# Patient Record
Sex: Male | Born: 2000 | Hispanic: Yes | Marital: Single | Smoking: Never smoker
Health system: Southern US, Community
[De-identification: ages and names within clinical notes are randomized; demographics above are authoritative.]

## PROBLEM LIST (undated history)

## (undated) DIAGNOSIS — J45909 Unspecified asthma, uncomplicated: Secondary | ICD-10-CM

---
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
## 2020-10-23 ENCOUNTER — Other Ambulatory Visit: Payer: Self-pay

## 2020-10-23 ENCOUNTER — Ambulatory Visit (INDEPENDENT_AMBULATORY_CARE_PROVIDER_SITE_OTHER): Payer: BC Managed Care – PPO | Admitting: Mental Health

## 2020-10-23 DIAGNOSIS — F4320 Adjustment disorder, unspecified: Secondary | ICD-10-CM

## 2020-10-23 NOTE — Progress Notes (Signed)
[facility] Counselor Initial Adult Exam  Name: Craig Chase Date: 10/23/2020 MRN: [id number] DOB: 2000/12/17 PCP: Sigmund Hazel, MD  Time spent: 31  Minutes   Reason for Visit [name] Problem: Patient stated he and his biological father have had issues in the relationship since last September. He lives w/ his stepfather, mother and his brother. His mother married his stepfather about 8 years ago. Patient would see his father on weekends. He began to realize he did not want to go see his father who continues to live in [place]. He came to learn that his father was unfaithful to his mother which was a catalyst to the marriage ending. Patient tried to talk w/ his father last year about getting the truth as his father has not been accountable for his behaviors that led to the end of the marriage. He wants to not take the pain in of this relationship into his adult life. He does not want to take his father's qualities into his future relationships. He wants to ensure he practices empathy in his current and future relationships.   Mental Status Exam:    Appearance:    Casual     Behavior:   Appropriate  Motor:   WNL  Speech/Language:    Clear and Coherent  Affect:   Full range   Mood:   Euthymic  Thought process:   normal  Thought content:     WNL  Sensory/Perceptual disturbances:     none  Orientation:   x4  Attention:   Good  Concentration:   Good  Memory:   Intact  Fund of knowledge:    Consistent with age and development  Insight:     Good  Judgment:    Good  Impulse Control:   Good    Reported Symptoms:   Some anxiety and sad feelings  Risk Assessment: Danger to Self:  No Self-injurious Behavior: No Danger to Others: No Duty to Warn:no Physical Aggression / Violence:No  Access to Firearms a concern: No  Gang Involvement:No  Patient / guardian was educated about steps to take if suicide or homicide risk level increases between visits: yes While future psychiatric events  cannot be accurately predicted, the patient does not currently require acute inpatient psychiatric care and does not currently meet [facility] involuntary commitment criteria.  Substance Abuse History: Current substance abuse: none  Past Psychiatric History:   Outpatient Providers: [facility] - 2019 History of Psych Hospitalization: No  Psychological Testing: none   Abuse History: Victim - none Report needed: No. Victim of Neglect:No. Perpetrator of none  Witness / Exposure to Domestic Violence: No   Protective Services Involvement: No  Witness to [employer] Violence:  No   Family History: Raised by mother and stepfather. Would see bio father on weekends  Living situation: the patient lives with their family  Relationship Status: single  Support Systems; mother, family, friend   Surveyor, quantity Stress:  none  Income/Employment/Disability: retail- part time  Financial planner: none  Educational History: Education: [employer]- [employer]  Religion/Sprituality/World View:   none  Any cultural differences that may affect / interfere with treatment:  none  Recreation/Hobbies:  Thrift store, gym, movies  Stressors: none  Strengths: intelligent, support system  Barriers:  none  Medical History/Surgical History:  Past Medical History:  Diagnosis Date  . Asthma      Medications: Current Outpatient Medications  Medication Sig Dispense Refill  . ibuprofen (ADVIL,MOTRIN) 600 MG tablet Take 1 tablet (600 mg total) by mouth every  6 (six) hours as needed for moderate pain. 30 tablet 0   No current facility-administered medications for this visit.     Diagnoses:    ICD-10-CM   1. Adjustment disorder, unspecified type  F43.20     Plan of Care: TBD   [name] Session, [facility]

## 2020-11-11 ENCOUNTER — Other Ambulatory Visit: Payer: Self-pay

## 2020-11-11 ENCOUNTER — Ambulatory Visit (INDEPENDENT_AMBULATORY_CARE_PROVIDER_SITE_OTHER): Payer: BC Managed Care – PPO | Admitting: Mental Health

## 2020-11-11 DIAGNOSIS — F4320 Adjustment disorder, unspecified: Secondary | ICD-10-CM

## 2020-11-11 NOTE — Progress Notes (Signed)
[facility] Counselor Initial Adult Exam  Name: Craig Chase Date: 11/11/2020 MRN: [id number] DOB: [birth year] PCP: Sigmund Hazel, MD  Time spent: 34  Minutes   Treatment:  Individual therapy  Mental Status Exam:    Appearance:    Casual     Behavior:   Appropriate  Motor:   WNL  Speech/Language:    Clear and Coherent  Affect:   Full range   Mood:   Euthymic  Thought process:   normal  Thought content:     WNL  Sensory/Perceptual disturbances:     none  Orientation:   x4  Attention:   Good  Concentration:   Good  Memory:   Intact  Fund of knowledge:    Consistent with age and development  Insight:     Good  Judgment:    Good  Impulse Control:   Good    Reported Symptoms:   Some anxiety and sad feelings  Risk Assessment: Danger to Self:  No Self-injurious Behavior: No Danger to Others: No Duty to Warn:no Physical Aggression / Violence:No  Access to Firearms a concern: No  Gang Involvement:No  Patient / guardian was educated about steps to take if suicide or homicide risk level increases between visits: yes While future psychiatric events cannot be accurately predicted, the patient does not currently require acute inpatient psychiatric care and does not currently meet [facility] involuntary commitment criteria.  Medications: Current Outpatient Medications  Medication Sig Dispense Refill  . ibuprofen (ADVIL,MOTRIN) 600 MG tablet Take 1 tablet (600 mg total) by mouth every 6 (six) hours as needed for moderate pain. 30 tablet 0   No current facility-administered medications for this visit.    Subjective:  Patient presents on time for today's session.  Continue to assess needs and recent events.  He said he had a pleasant Mother's Day sharing experiences he had with family.  He shared more detail and content related to the relationship with his father, often feeling judged or criticized by him as an example, patient shared how his father might even criticize his  clothing choices.  Patient's attire is conservative and he reflects how this and other choices that reflect who he is but not what his father prefers, that his father tends to focus on his own opinions and not giving value to patient's thoughts or opinions.  He continues to express wanting to work on being mindful of traits or characteristics with which he may have learned from his father that may negatively affect relationships for himself going forward.  Encouraged him to consider between sessions instances where he notices these tendencies.  Diagnoses:    ICD-10-CM   1. Adjustment disorder, unspecified type  F43.20      Plan: Patient is to use CBT, mindfulness and coping skills to help manage decrease symptoms. Patient    Long-term goal:  Reduce overall level, frequency, and intensity of the feelings of depression, anxiety and panic so that daily functioning is not impaired.  Short-term goal:  Increase insight into how his strained relationship with his father affects his feelings and behaviors        Increase awareness of his negative and/or critical communication with others                              Verbalize an understanding of the role that distorted thinking plays in creating fears, excessive worry, and ruminations.  Assessment of progress:  progressing  [name] Session, [facility]

## 2020-11-25 ENCOUNTER — Other Ambulatory Visit: Payer: Self-pay

## 2020-11-25 ENCOUNTER — Ambulatory Visit (INDEPENDENT_AMBULATORY_CARE_PROVIDER_SITE_OTHER): Payer: BC Managed Care – PPO | Admitting: Mental Health

## 2020-11-25 DIAGNOSIS — F4320 Adjustment disorder, unspecified: Secondary | ICD-10-CM

## 2020-11-25 NOTE — Progress Notes (Signed)
[facility] Counselor psychotherapy note  Name: Craig Chase Date: 11/25/2020 MRN: [id number] DOB: 17-Oct-2000 PCP: Sigmund Hazel, MD  Time spent: 61  Minutes   Treatment:  Individual therapy  Mental Status Exam:    Appearance:    Casual     Behavior:   Appropriate  Motor:   WNL  Speech/Language:    Clear and Coherent  Affect:   Full range   Mood:   Euthymic  Thought process:   normal  Thought content:     WNL  Sensory/Perceptual disturbances:     none  Orientation:   x4  Attention:   Good  Concentration:   Good  Memory:   Intact  Fund of knowledge:    Consistent with age and development  Insight:     Good  Judgment:    Good  Impulse Control:   Good    Reported Symptoms:   Some anxiety and sad feelings  Risk Assessment: Danger to Self:  No Self-injurious Behavior: No Danger to Others: No Duty to Warn:no Physical Aggression / Violence:No  Access to Firearms a concern: No  Gang Involvement:No  Patient / guardian was educated about steps to take if suicide or homicide risk level increases between visits: yes While future psychiatric events cannot be accurately predicted, the patient does not currently require acute inpatient psychiatric care and does not currently meet [facility] involuntary commitment criteria.  Medications: Current Outpatient Medications  Medication Sig Dispense Refill  . ibuprofen (ADVIL,MOTRIN) 600 MG tablet Take 1 tablet (600 mg total) by mouth every 6 (six) hours as needed for moderate pain. 30 tablet 0   No current facility-administered medications for this visit.    Subjective:  Patient presents on time for today's session.  Patient shared how he had a recent challenging work experience. 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 Patient stated that he is new to this department at his job and was surprised the coworker was critical of him as he was  informed by his supervisor. Patient denied that he was reprimanded in any way, that it was not a formal complaint. Patient went on to share how he was contacted recently by his paternal uncle, who patient feels took more of the side of his father as patient and his father remain relationally strained with no contact. Patient shared again his efforts to communicate his feelings with his father, however, his father struggled to acknowledge his feelings regarding a falling out they had had months prior. Personal identified scene for his father to actively listen to his thoughts feelings and needs versus dictating to him from a parental hierarchical framework, which patient shared has been typical over the last few years, I'll be patient is now an adult. Patient processed feelings related to the discussion with his uncle, wanting to have a close relationship with his paternal side of the family however, is hesitant to visit them, which is in [place] due to knowing his father well make himself present at some point during the visit, which patient is not comfortable due to these past events.   Interventions: Supportive therapy, motivational interviewing  Diagnoses:    ICD-10-CM   1. Adjustment disorder, unspecified type  F43.20      Plan: Patient is to use CBT, mindfulness and coping skills to help manage decrease symptoms. Patient to continue to advocate and expressing his thoughts and feelings related to his family members as needed.  Long-term goal:  Reduce overall level, frequency, and intensity of the feelings of depression, anxiety and panic so that daily functioning is not impaired.  Short-term goal:  Increase insight into how his strained relationship with his father affects his feelings and behaviors        Increase awareness of his negative and/or critical communication with others                              Verbalize an understanding of the role that distorted thinking plays in creating  fears, excessive worry, and ruminations.  Assessment of progress:  progressing    [name] Session, [facility]

## 2020-12-21 ENCOUNTER — Ambulatory Visit (INDEPENDENT_AMBULATORY_CARE_PROVIDER_SITE_OTHER): Payer: BC Managed Care – PPO | Admitting: Mental Health

## 2020-12-21 ENCOUNTER — Other Ambulatory Visit: Payer: Self-pay

## 2020-12-21 DIAGNOSIS — F4320 Adjustment disorder, unspecified: Secondary | ICD-10-CM

## 2020-12-21 NOTE — Progress Notes (Signed)
[facility] psychotherapy note  Name: Craig Chase Date: 12/21/2020 MRN: [id number] DOB: [birth year] PCP: Sigmund Hazel, MD  Time spent: 36 Minutes   Treatment:  Individual therapy  Mental Status Exam:    Appearance:    Casual     Behavior:   Appropriate  Motor:   WNL  Speech/Language:    Clear and Coherent  Affect:   Full range   Mood:   Euthymic  Thought process:   normal  Thought content:     WNL  Sensory/Perceptual disturbances:     none  Orientation:   x4  Attention:   Good  Concentration:   Good  Memory:   Intact  Fund of knowledge:    Consistent with age and development  Insight:     Good  Judgment:    Good  Impulse Control:   Good    Reported Symptoms:   Some anxiety and sad feelings  Risk Assessment: Danger to Self:  No Self-injurious Behavior: No Danger to Others: No Duty to Warn:no Physical Aggression / Violence:No  Access to Firearms a concern: No  Gang Involvement:No  Patient / guardian was educated about steps to take if suicide or homicide risk level increases between visits: yes While future psychiatric events cannot be accurately predicted, the patient does not currently require acute inpatient psychiatric care and does not currently meet [facility] involuntary commitment criteria.  Medications: Current Outpatient Medications  Medication Sig Dispense Refill   ibuprofen (ADVIL,MOTRIN) 600 MG tablet Take 1 tablet (600 mg total) by mouth every 6 (six) hours as needed for moderate pain. 30 tablet 0   No current facility-administered medications for this visit.    Subjective:  Patient presents on time for today's session.  He shared recent events where he did not reach out to his father on Father's Day which was yesterday.  He went on to share how they continue to have no contact with 1 another, how patient wants to keep this boundary in place due to past events in the relationship.  Patient went on to share how he wants his father to  recognize how he has made him feel as patient went on to give more details related to having to cope with his father's judgment for many years and at times being compared to his younger brother.  He shared how he feels that his father tried to replicate patient and his brother being similar as his father and brother grew up extremely close and had many similar interests.  Patient shared how he is the "polar opposite" of his brother in many ways.  Patient shared how he has always been on the thin side regarding weight and how his father and his family's would call him "delgada", which is the Spanish translation for "thin".  He stated his father often would focus negatively on patient, commenting on his not being outgoing enough, not having enough interest in athletics as examples.  Patient shared how he feels that he limits how close he will share interpersonal thoughts and feelings with others in relationships.  He stated that he has noticed that he "hold back" when potential relationships seem to get more serious or significant.  Encouraged him to consider reasons for this tendency between sessions.  Also discussed potential benefits of journaling, writing a letter to his father in his journal for flexion.   Interventions: Supportive therapy, motivational interviewing  Diagnoses:    ICD-10-CM   1. Adjustment disorder, unspecified type  F43.20  Plan: Patient is to use CBT, mindfulness and coping skills to help manage decrease symptoms. Patient to continue to advocate and expressing his thoughts and feelings related to his family members as needed.   Long-term goal:  Reduce overall level, frequency, and intensity of the feelings of depression, anxiety and panic so that daily functioning is not impaired.  Short-term goal:  Increase insight into how his strained relationship with his father affects his feelings and behaviors        Increase awareness of his negative and/or critical  communication with others                              Verbalize an understanding of the role that distorted thinking plays in creating fears, excessive worry, and ruminations.  Assessment of progress:  progressing    [name] Session, [place]

## 2021-01-26 ENCOUNTER — Ambulatory Visit (INDEPENDENT_AMBULATORY_CARE_PROVIDER_SITE_OTHER): Payer: BC Managed Care – PPO | Admitting: Mental Health

## 2021-01-26 ENCOUNTER — Other Ambulatory Visit: Payer: Self-pay

## 2021-01-26 DIAGNOSIS — F4320 Adjustment disorder, unspecified: Secondary | ICD-10-CM | POA: Diagnosis not present

## 2021-01-26 NOTE — Progress Notes (Signed)
[facility] psychotherapy note  Name: Craig Chase Date: 01/26/2021 MRN: [id number] DOB: 04/11/2001 PCP: Sigmund Hazel, MD  Time spent: 102 Minutes   Treatment:  Individual therapy  Mental Status Exam:    Appearance:    Casual     Behavior:   Appropriate  Motor:   WNL  Speech/Language:    Clear and Coherent  Affect:   Full range   Mood:   Euthymic  Thought process:   normal  Thought content:     WNL  Sensory/Perceptual disturbances:     none  Orientation:   x4  Attention:   Good  Concentration:   Good  Memory:   Intact  Fund of knowledge:    Consistent with age and development  Insight:     Good  Judgment:    Good  Impulse Control:   Good    Reported Symptoms:   Some anxiety and sad feelings  Risk Assessment: Danger to Self:  No Self-injurious Behavior: No Danger to Others: No Duty to Warn:no Physical Aggression / Violence:No  Access to Firearms a concern: No  Gang Involvement:No  Patient / guardian was educated about steps to take if suicide or homicide risk level increases between visits: yes While future psychiatric events cannot be accurately predicted, the patient does not currently require acute inpatient psychiatric care and does not currently meet [facility] involuntary commitment criteria.  Medications: Current Outpatient Medications  Medication Sig Dispense Refill   ibuprofen (ADVIL,MOTRIN) 600 MG tablet Take 1 tablet (600 mg total) by mouth every 6 (six) hours as needed for moderate pain. 30 tablet 0   No current facility-administered medications for this visit.    Subjective: Patient presents for session on time.  He shared recent events in progress where he stated that he reached out to his father via email recently.  Patient took time in session to read the email and his father's subsequent response.  Patient was clear with his feelings, experiences that have affected him in the relationship.  Ultimately, patient feels his father's response  left him wanting more, however, patient stated how he was not surprised due to his history of relationship with his father.  Ultimately patient did not feel validated regarding his feelings particularly when his father has made numerous hurtful comments the patient has felt judged and at times change.  At this point, the patient plans to maintain the boundary of having no communication with his father, but felt that it was helpful to try and communicate his feelings, again.  Provide support throughout, facilitated patient identifying what he feels was helpful about writing this letter where he was able to share what he was further learned about himself and being mindful of any expectations for his father to change anytime in the near future.   Interventions: Supportive therapy, motivational interviewing  Diagnoses:    ICD-10-CM   1. Adjustment disorder, unspecified type  F43.20          Plan: Patient is to use CBT, mindfulness and coping skills to help manage decrease symptoms. Patient to continue to advocate and expressing his thoughts and feelings related to his family members as needed.   Long-term goal:  Reduce overall level, frequency, and intensity of the feelings of depression, anxiety and panic so that daily functioning is not impaired.  Short-term goal:  Increase insight into how his strained relationship with his father affects his feelings and behaviors        Increase awareness of his negative and/or  critical communication with others                              Verbalize an understanding of the role that distorted thinking plays in creating fears, excessive worry, and ruminations.  Assessment of progress:  progressing    [name] Session, [facility]

## 2021-02-22 ENCOUNTER — Other Ambulatory Visit: Payer: Self-pay

## 2021-02-22 ENCOUNTER — Ambulatory Visit (INDEPENDENT_AMBULATORY_CARE_PROVIDER_SITE_OTHER): Payer: BC Managed Care – PPO | Admitting: Mental Health

## 2021-02-22 DIAGNOSIS — F4320 Adjustment disorder, unspecified: Secondary | ICD-10-CM | POA: Diagnosis not present

## 2021-02-22 NOTE — Progress Notes (Signed)
[facility] psychotherapy note  Name: Craig Chase Date: 02/22/2021 MRN: [id number] DOB: 01-16-2001 PCP: Sigmund Hazel, MD  Time spent: 57 Minutes   Treatment:  Individual therapy  Mental Status Exam:    Appearance:    Casual     Behavior:   Appropriate  Motor:   WNL  Speech/Language:    Clear and Coherent  Affect:   Full range   Mood:   Euthymic  Thought process:   normal  Thought content:     WNL  Sensory/Perceptual disturbances:     none  Orientation:   x4  Attention:   Good  Concentration:   Good  Memory:   Intact  Fund of knowledge:    Consistent with age and development  Insight:     Good  Judgment:    Good  Impulse Control:   Good    Reported Symptoms:   Some anxiety and sad feelings  Risk Assessment: Danger to Self:  No Self-injurious Behavior: No Danger to Others: No Duty to Warn:no Physical Aggression / Violence:No  Access to Firearms a concern: No  Gang Involvement:No  Patient / guardian was educated about steps to take if suicide or homicide risk level increases between visits: yes While future psychiatric events cannot be accurately predicted, the patient does not currently require acute inpatient psychiatric care and does not currently meet [facility] involuntary commitment criteria.  Medications: Current Outpatient Medications  Medication Sig Dispense Refill   ibuprofen (ADVIL,MOTRIN) 600 MG tablet Take 1 tablet (600 mg total) by mouth every 6 (six) hours as needed for moderate pain. 30 tablet 0   No current facility-administered medications for this visit.    Subjective: Patient presents for session on time. Patient shared progress, how he is currently in his last semester of college to achieve his associates degree. He said his current course load is not stressful and the balance between work and school will be easily achieved. Facilitated his identifying continued thoughts and feelings related to the relationships a family,  specifically without of his father. He continues to keep the boundary in place as discussed in previous sessions. Facilitated his further clarifying the challenges he has in allowing himself to be in a relationship, romantic as this was shared in previous sessions. Time was spent further exploring with patient, where he identified the fact that he came from a family where his parents separated leading to his sometimes thinking "what's the point, it probably won't last", referring to you potential outcome of starting a relationship trying to maintain one.  Interventions: Supportive therapy, motivational interviewing  Diagnoses:    ICD-10-CM   1. Adjustment disorder, unspecified type  F43.20        Plan: Patient is to use CBT, mindfulness and coping skills to help manage decrease symptoms. Patient to continue to advocate and expressing his thoughts and feelings related to his family members as needed.   Long-term goal:  Reduce overall level, frequency, and intensity of the feelings of depression, anxiety and panic so that daily functioning is not impaired.  Short-term goal:  Increase insight into how his strained relationship with his father affects his feelings and behaviors        Increase awareness of his negative and/or critical communication with others                              Verbalize an understanding of the role that distorted thinking plays in creating  fears, excessive worry, and ruminations.  Assessment of progress:  progressing    [name] Session, [facility]

## 2021-03-29 ENCOUNTER — Other Ambulatory Visit: Payer: Self-pay

## 2021-03-29 ENCOUNTER — Ambulatory Visit (INDEPENDENT_AMBULATORY_CARE_PROVIDER_SITE_OTHER): Payer: BC Managed Care – PPO | Admitting: Mental Health

## 2021-03-29 DIAGNOSIS — F4320 Adjustment disorder, unspecified: Secondary | ICD-10-CM | POA: Diagnosis not present

## 2021-03-29 NOTE — Progress Notes (Signed)
[facility] psychotherapy note  Name: Craig Chase Date: 03/29/2021 MRN: [id number] DOB: [birth year] PCP: Sigmund Hazel, MD  Time spent: 39 Minutes   Treatment:  Individual therapy  Mental Status Exam:    Appearance:    Casual     Behavior:   Appropriate  Motor:   WNL  Speech/Language:    Clear and Coherent  Affect:   Full range   Mood:   Euthymic  Thought process:   normal  Thought content:     WNL  Sensory/Perceptual disturbances:     none  Orientation:   x4  Attention:   Good  Concentration:   Good  Memory:   Intact  Fund of knowledge:    Consistent with age and development  Insight:     Good  Judgment:    Good  Impulse Control:   Good    Reported Symptoms:   Some anxiety and sad feelings  Risk Assessment: Danger to Self:  No Self-injurious Behavior: No Danger to Others: No Duty to Warn:no Physical Aggression / Violence:No  Access to Firearms a concern: No  Gang Involvement:No  Patient / guardian was educated about steps to take if suicide or homicide risk level increases between visits: yes While future psychiatric events cannot be accurately predicted, the patient does not currently require acute inpatient psychiatric care and does not currently meet [facility] involuntary commitment criteria.  Medications: Current Outpatient Medications  Medication Sig Dispense Refill   ibuprofen (ADVIL,MOTRIN) 600 MG tablet Take 1 tablet (600 mg total) by mouth every 6 (six) hours as needed for moderate pain. 30 tablet 0   No current facility-administered medications for this visit.    Subjective: Patient presents for session on time.  Patient shared progress. 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 He continues to enjoy both work and school, sharing experiences. Continues to make more friends, shared some experiences he's had socially. He continues to have some guardedness  around getting into a romantic relationship at this point, sharing some past experiences a few months ago. Identified it it being accommodation of his apprehension in starting a relationship due to how it might affect his life in other ways. He shares how he continues to feel some uneasiness about the idea of having a relationship romantically. Facilitated his identifying false, feelings associated related to the guardedness he identified today.  Interventions: Supportive therapy, motivational interviewing  Diagnoses:    ICD-10-CM   1. Adjustment disorder, unspecified type  F43.20         Plan: Patient is to use CBT, mindfulness and coping skills to help manage decrease symptoms. Patient to continue to advocate and expressing his thoughts and feelings related to his family members as needed.   Long-term goal:  Reduce overall level, frequency, and intensity of the feelings of depression, anxiety and panic so that daily functioning is not impaired.  Short-term goal:  Increase insight into how his strained relationship with his father affects his feelings and behaviors        Increase awareness of his negative and/or critical communication with others                              Verbalize an understanding of the role that distorted thinking plays in creating fears, excessive worry, and ruminations.  Assessment of progress:  progressing    [name] Session,  [facility]

## 2021-04-30 ENCOUNTER — Ambulatory Visit (INDEPENDENT_AMBULATORY_CARE_PROVIDER_SITE_OTHER): Payer: BC Managed Care – PPO | Admitting: Mental Health

## 2021-04-30 ENCOUNTER — Other Ambulatory Visit: Payer: Self-pay

## 2021-04-30 DIAGNOSIS — F4320 Adjustment disorder, unspecified: Secondary | ICD-10-CM | POA: Diagnosis not present

## 2021-04-30 NOTE — Progress Notes (Signed)
[facility] psychotherapy note  Name: Craig Chase Date: 04/30/2021 MRN: [id number] DOB: 13-Feb-2001 PCP: Sigmund Hazel, MD  Time spent: 21 Minutes   Treatment:  Individual therapy  Mental Status Exam:    Appearance:    Casual     Behavior:   Appropriate  Motor:   WNL  Speech/Language:    Clear and Coherent  Affect:   Full range   Mood:   Euthymic  Thought process:   normal  Thought content:     WNL  Sensory/Perceptual disturbances:     none  Orientation:   x4  Attention:   Good  Concentration:   Good  Memory:   Intact  Fund of knowledge:    Consistent with age and development  Insight:     Good  Judgment:    Good  Impulse Control:   Good    Reported Symptoms:   Some anxiety and sad feelings  Risk Assessment: Danger to Self:  No Self-injurious Behavior: No Danger to Others: No Duty to Warn:no Physical Aggression / Violence:No  Access to Firearms a concern: No  Gang Involvement:No  Patient / guardian was educated about steps to take if suicide or homicide risk level increases between visits: yes While future psychiatric events cannot be accurately predicted, the patient does not currently require acute inpatient psychiatric care and does not currently meet [facility] involuntary commitment criteria.  Medications: Current Outpatient Medications  Medication Sig Dispense Refill   ibuprofen (ADVIL,MOTRIN) 600 MG tablet Take 1 tablet (600 mg total) by mouth every 6 (six) hours as needed for moderate pain. 30 tablet 0   No current facility-administered medications for this visit.    Subjective: Patient presents for session on time.  He shared progress, how he continues to not talk with his father.  He went on to share how his brother and father got into an argument a few months ago and they are also not speaking.  He stated his brother has chosen not to speak to his father and at this point, they both were not speaking to him and patient identified a sense of  validation as he felt blamed by his paternal side of the family after their initial falling out last year.  Patient identified how he needs to maintain this boundary going on to share reasons as we facilitated his identifying thoughts and feelings related.  Other ways to cope and care for himself such as his continuing his exercise regimen which he has adhered to for the last 6 months as a primary way with which he plans to do so.  As well as watching his diet toward health and wellness.    Interventions: Supportive therapy, motivational interviewing  Diagnoses:    ICD-10-CM   1. Adjustment disorder, unspecified type  F43.20          Plan: Patient is to use CBT, mindfulness and coping skills to help manage decrease symptoms. Patient to continue to advocate and expressing his thoughts and feelings related to his family members as needed.   Long-term goal:  Reduce overall level, frequency, and intensity of the feelings of depression, anxiety and panic so that daily functioning is not impaired.  Short-term goal:  Increase insight into how his strained relationship with his father affects his feelings and behaviors        Increase awareness of his negative and/or critical communication with others  Verbalize an understanding of the role that distorted thinking plays in creating fears, excessive worry, and ruminations.  Assessment of progress:  progressing    Anson [place], [facility]

## 2021-05-13 ENCOUNTER — Other Ambulatory Visit: Payer: Self-pay

## 2021-05-13 ENCOUNTER — Ambulatory Visit (INDEPENDENT_AMBULATORY_CARE_PROVIDER_SITE_OTHER): Payer: BC Managed Care – PPO | Admitting: Mental Health

## 2021-05-13 DIAGNOSIS — F4320 Adjustment disorder, unspecified: Secondary | ICD-10-CM | POA: Diagnosis not present

## 2021-05-13 NOTE — Progress Notes (Signed)
[facility] psych[facility] note  Name: Craig Chase Date: 05/13/2021 MRN: [id number] DOB: 03-Jan-2001 PCP: Sigmund Hazel, MD  Time spent: 40 Minutes   Treatment:  Individual therapy  Mental Status Exam:    Appearance:    Casual     Behavior:   Appropriate  Motor:   WNL  Speech/Language:    Clear and Coherent  Affect:   Full range   Mood:   Euthymic  Thought process:   normal  Thought content:     WNL  Sensory/Perceptual disturbances:     none  Orientation:   x4  Attention:   Good  Concentration:   Good  Memory:   Intact  Fund of knowledge:    Consistent with age and development  Insight:     Good  Judgment:    Good  Impulse Control:   Good    Reported Symptoms:   Some anxiety and sad feelings  Risk Assessment: Danger to Self:  No Self-injurious Behavior: No Danger to Others: No Duty to Warn:no Physical Aggression / Violence:No  Access to Firearms a concern: No  Gang Involvement:No  Patient / guardian was educated about steps to take if suicide or homicide risk level increases between visits: yes While future psychiatric events cannot be accurately predicted, the patient does not currently require acute inpatient psychiatric care and does not currently meet [facility] involuntary commitment criteria.  Medications: Current Outpatient Medications  Medication Sig Dispense Refill   ibuprofen (ADVIL,MOTRIN) 600 MG tablet Take 1 tablet (600 mg total) by mouth every 6 (six) hours as needed for moderate pain. 30 tablet 0   No current facility-administered medications for this visit.    Subjective: Patient presents for session on time.  Patient shared recent events, progress.  Stated that he has been working more recently and looks forward to having the next 2 days off.  Reports school is going well.  Purpose of today's session related primarily to the relationship he has with his best friend.  He stated that his best friend has some commonalities, but had a  history of a strained relationship with her father's however, patient stated that his friend also his father about a year ago as he passed from COVID-19.  Patient stated that he tends to overextend himself at times in the relationship and has he shared his efforts to be supportive of his friend due to some recent events.  He identified his friend may also need his support however, patient identified needing support from his friend which seldom occurs due to his friend typically focusing on self.  Through guided discovery, patient identified need to remind himself between sessions of refraining from overextending himself, to draw limits and boundaries also maintaining friendships.   Interventions: Supportive therapy, motivational interviewing  Diagnoses:    ICD-10-CM   1. Adjustment disorder, unspecified type  F43.20           Plan: Patient is to use CBT, mindfulness and coping skills to help manage decrease symptoms. Patient to continue to advocate and expressing his thoughts and feelings related to his family members as needed.   Long-term goal:  Reduce overall level, frequency, and intensity of the feelings of depression, anxiety and panic so that daily functioning is not impaired.  Short-term goal:  Increase insight into how his strained relationship with his father affects his feelings and behaviors        Increase awareness of his negative and/or critical communication with others  Verbalize an understanding of the role that distorted thinking plays in creating fears, excessive worry, and ruminations.  Assessment of progress:  progressing    Anson [place], [facility]

## 2021-06-22 ENCOUNTER — Other Ambulatory Visit: Payer: Self-pay

## 2021-06-22 ENCOUNTER — Ambulatory Visit (INDEPENDENT_AMBULATORY_CARE_PROVIDER_SITE_OTHER): Payer: BC Managed Care – PPO | Admitting: Mental Health

## 2021-06-22 DIAGNOSIS — F4320 Adjustment disorder, unspecified: Secondary | ICD-10-CM

## 2021-06-22 NOTE — Progress Notes (Signed)
[facility] psychotherapy note  Name: Craig Chase Date: 06/22/2021 MRN: [id number] DOB: 2001/03/09 PCP: Sigmund Hazel, MD  Time spent: 79 Minutes   Treatment:  Individual therapy  Mental Status Exam:    Appearance:    Casual     Behavior:   Appropriate  Motor:   WNL  Speech/Language:    Clear and Coherent  Affect:   Full range   Mood:   Euthymic  Thought process:   normal  Thought content:     WNL  Sensory/Perceptual disturbances:     none  Orientation:   x4  Attention:   Good  Concentration:   Good  Memory:   Intact  Fund of knowledge:    Consistent with age and development  Insight:     Good  Judgment:    Good  Impulse Control:   Good    Reported Symptoms:   Some anxiety and sad feelings  Risk Assessment: Danger to Self:  No Self-injurious Behavior: No Danger to Others: No Duty to Warn:no Physical Aggression / Violence:No  Access to Firearms a concern: No  Gang Involvement:No  Patient / guardian was educated about steps to take if suicide or homicide risk level increases between visits: yes While future psychiatric events cannot be accurately predicted, the patient does not currently require acute inpatient psychiatric care and does not currently meet [facility] involuntary commitment criteria.  Medications: Current Outpatient Medications  Medication Sig Dispense Refill   ibuprofen (ADVIL,MOTRIN) 600 MG tablet Take 1 tablet (600 mg total) by mouth every 6 (six) hours as needed for moderate pain. 30 tablet 0   No current facility-administered medications for this visit.    Subjective: Patient presents for session on time.  He shared progress. Enjoyed Thanksgiving w/ family.  Has not spoken with his father and does not plan to visit him or his paternal side of the family over the current holiday.  He stated that he has had limited contact with any of this family members and expresses needing to keep this boundary in place presently.  He reports still  being concerned about friend who has been spending money frivously.,  Through guided discovery, he identified the need to avoid giving his friend any unsolicited opinions and allow him to make his own conclusions about how to handle his recent significant influx of financial inheritance.  He stated that he completed his college course work to earn an associate's degree and is looking for employment.  He stated that he plans to apply at 1 job he found recently with the hopes of getting an interview.  He identified having some concerns about not being fully qualified due to not having a 4-year degree, however, he identified the need to take a step in this area, to be assertive.   Interventions: Supportive therapy, motivational interviewing  Diagnoses:    ICD-10-CM   1. Adjustment disorder, unspecified type  F43.20            Plan: Patient is to use CBT, mindfulness and coping skills to help manage decrease symptoms. Patient to continue to advocate and expressing his thoughts and feelings related to his family members as needed.   Long-term goal:  Reduce overall level, frequency, and intensity of the feelings of depression, anxiety and panic so that daily functioning is not impaired.  Short-term goal:  Increase insight into how his strained relationship with his father affects his feelings and behaviors        Increase awareness of his negative  and/or critical communication with others                              Verbalize an understanding of the role that distorted thinking plays in creating fears, excessive worry, and ruminations.  Assessment of progress:  progressing    [name] Session, [facility]

## 2021-07-23 ENCOUNTER — Ambulatory Visit (INDEPENDENT_AMBULATORY_CARE_PROVIDER_SITE_OTHER): Payer: BC Managed Care – PPO | Admitting: Mental Health

## 2021-07-23 ENCOUNTER — Other Ambulatory Visit: Payer: Self-pay

## 2021-07-23 DIAGNOSIS — F4320 Adjustment disorder, unspecified: Secondary | ICD-10-CM

## 2021-07-23 NOTE — Progress Notes (Signed)
[facility] psychotherapy note  Name: Craig Chase Date: 07/26/2021 MRN: [id number] DOB: [birth year] PCP: Sigmund Hazel, MD  Time spent: 35 Minutes   Treatment:  Individual therapy  Mental Status Exam:    Appearance:    Casual     Behavior:   Appropriate  Motor:   WNL  Speech/Language:    Clear and Coherent  Affect:   Full range   Mood:   Euthymic  Thought process:   normal  Thought content:     WNL  Sensory/Perceptual disturbances:     none  Orientation:   x4  Attention:   Good  Concentration:   Good  Memory:   Intact  Fund of knowledge:    Consistent with age and development  Insight:     Good  Judgment:    Good  Impulse Control:   Good    Reported Symptoms:   Some anxiety and sad feelings  Risk Assessment: Danger to Self:  No Self-injurious Behavior: No Danger to Others: No Duty to Warn:no Physical Aggression / Violence:No  Access to Firearms a concern: No  Gang Involvement:No  Patient / guardian was educated about steps to take if suicide or homicide risk level increases between visits: yes While future psychiatric events cannot be accurately predicted, the patient does not currently require acute inpatient psychiatric care and does not currently meet [facility] involuntary commitment criteria.  Medications: Current Outpatient Medications  Medication Sig Dispense Refill   ibuprofen (ADVIL,MOTRIN) 600 MG tablet Take 1 tablet (600 mg total) by mouth every 6 (six) hours as needed for moderate pain. 30 tablet 0   No current facility-administered medications for this visit.    Subjective: Patient presents for session on time.  Patient shared how he had a pleasant Christmas holiday, sharing some experiences he had with family.  He stated that he had a text message from his biological father or his father expressed some sentiments including "I love you".  Patient stated he did not initially know how to react, handle this as they continue to have a strained  relationship with no contact.  He stated that his grandmother had sent him a message as well following his father's initial message.  As a result, patient feels that they have an alignment and abuse, he stated that his father, mother and uncle all work together daily.  He shared how patient was very close to his uncle most of his life and his uncle not reaching out to him for the past several months to a year, is particularly hurtful.  He identified the need to continue to maintain the boundaries he has set these relationships, primarily with his father.  He stated that he is now considering going back to school to complete a bachelor's degree and also work full-time, he is currently seeking employment and has some interviews scheduled.  He expresses excitement and motivation in these areas of his life.   Interventions: Supportive therapy, motivational interviewing  Diagnoses:    ICD-10-CM   1. Adjustment disorder, unspecified type  F43.20             Plan: Patient is to use CBT, mindfulness and coping skills to help manage decrease symptoms. Patient to continue to advocate and expressing his thoughts and feelings related to his family members as needed.   Long-term goal:  Reduce overall level, frequency, and intensity of the feelings of depression, anxiety and panic so that daily functioning is not impaired.  Short-term goal:  Increase insight into  how his strained relationship with his father affects his feelings and behaviors        Increase awareness of his negative and/or critical communication with others                              Verbalize an understanding of the role that distorted thinking plays in creating fears, excessive worry, and ruminations.  Assessment of progress:  progressing    [name] Session, [facility]

## 2021-08-19 ENCOUNTER — Other Ambulatory Visit: Payer: Self-pay

## 2021-08-19 ENCOUNTER — Ambulatory Visit (INDEPENDENT_AMBULATORY_CARE_PROVIDER_SITE_OTHER): Payer: BC Managed Care – PPO | Admitting: Mental Health

## 2021-08-19 DIAGNOSIS — F4320 Adjustment disorder, unspecified: Secondary | ICD-10-CM

## 2021-08-19 NOTE — Progress Notes (Signed)
[facility] psychotherapy note  Name: Craig Chase Date: 08/19/2021 MRN: [id number] DOB: 2001/05/01 PCP: Craig Hazel, MD  Time spent: 69 Minutes   Treatment:  Individual therapy  Mental Status Exam:    Appearance:    Casual     Behavior:   Appropriate  Motor:   WNL  Speech/Language:    Clear and Coherent  Affect:   Full range   Mood:   Euthymic  Thought process:   normal  Thought content:     WNL  Sensory/Perceptual disturbances:     none  Orientation:   x4  Attention:   Good  Concentration:   Good  Memory:   Intact  Fund of knowledge:    Consistent with age and development  Insight:     Good  Judgment:    Good  Impulse Control:   Good    Reported Symptoms:   Some anxiety and sad feelings  Risk Assessment: Danger to Self:  No Self-injurious Behavior: No Danger to Others: No Duty to Warn:no Physical Aggression / Violence:No  Access to Firearms a concern: No  Gang Involvement:No  Patient / guardian was educated about steps to take if suicide or homicide risk level increases between visits: yes While future psychiatric events cannot be accurately predicted, the patient does not currently require acute inpatient psychiatric care and does not currently meet [facility] involuntary commitment criteria.  Medications: Current Outpatient Medications  Medication Sig Dispense Refill   ibuprofen (ADVIL,MOTRIN) 600 MG tablet Take 1 tablet (600 mg total) by mouth every 6 (six) hours as needed for moderate pain. 30 tablet 0   No current facility-administered medications for this visit.    Subjective: Patient presents for session on time.  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  He stated he has an interview approaching and 2 different potential employers.  He stated that his current job he is receiving a promotion which will give him a pay increase.  He plans to take this  position while also looking at these other options.  He shared how he had a recent disturbing dream, where he confronted his father on various issues.  He stated that in his dream, it was out of character for him to raise his voice.  Through guided discovery, he identified thoughts and feelings related, how he continues to have residual feelings of disappointment related to his father's actions.  He continues to have no contact with his father, stated he had a recent conversation with his grandmother on 2 different occasions which went well overall.  Continuing to maintain this boundary with his father is his plan and he gave various reasons this is needed, and his significant attempts in the past to reconcile with his father where he feels comfortable with his decision.   Interventions: Supportive therapy, motivational interviewing  Diagnoses:    ICD-10-CM   1. Adjustment disorder, unspecified type  F43.20              Plan: Patient is to use CBT, mindfulness and coping skills to help manage decrease symptoms. Patient to continue to advocate and expressing his thoughts and feelings related to his family members as needed.   Long-term goal:  Reduce overall level, frequency, and intensity of the feelings of depression, anxiety and panic so that daily functioning is not impaired.  Short-term goal:  Increase insight into how his strained relationship with his  father affects his feelings and behaviors        Increase awareness of his negative and/or critical communication with others                              Verbalize an understanding of the role that distorted thinking plays in creating fears, excessive worry, and ruminations.  Assessment of progress:  progressing    Craig Chase Session, [facility]

## 2021-09-09 ENCOUNTER — Ambulatory Visit: Payer: Self-pay | Admitting: Podiatry

## 2021-09-09 ENCOUNTER — Ambulatory Visit: Payer: BC Managed Care – PPO | Admitting: Podiatry

## 2021-09-09 ENCOUNTER — Encounter: Payer: Self-pay | Admitting: Podiatry

## 2021-09-09 ENCOUNTER — Other Ambulatory Visit: Payer: Self-pay

## 2021-09-09 DIAGNOSIS — J45909 Unspecified asthma, uncomplicated: Secondary | ICD-10-CM | POA: Insufficient documentation

## 2021-09-09 DIAGNOSIS — L209 Atopic dermatitis, unspecified: Secondary | ICD-10-CM | POA: Insufficient documentation

## 2021-09-09 DIAGNOSIS — L600 Ingrowing nail: Secondary | ICD-10-CM

## 2021-09-09 DIAGNOSIS — J302 Other seasonal allergic rhinitis: Secondary | ICD-10-CM | POA: Insufficient documentation

## 2021-09-09 DIAGNOSIS — F419 Anxiety disorder, unspecified: Secondary | ICD-10-CM | POA: Insufficient documentation

## 2021-09-09 MED ORDER — DOXYCYCLINE HYCLATE 100 MG PO TABS: 100.0000 mg | ORAL_TABLET | Freq: Two times a day (BID) | ORAL | 0 refills | Status: AC

## 2021-09-09 NOTE — Progress Notes (Signed)
?Subjective:  ?Patient ID: Craig Chase, male    DOB: 2001/06/18,  MRN: [id number] ? ?Chief Complaint  ?Patient presents with  ? Nail Problem  ?  Patient presents for ingrown toenail right hallux lateral border x 1 week  ? ? ?21 y.o. male presents with the above complaint.  Patient presents with right lateral border ingrown.  Patient states is painful to touch.  He states that it hurts with ambulation.  He has tried self digging it out which has not helped.  He has not been taking any antibiotics.  He denies any other acute complaints.  He went out and removed to have it removed.  Pain on palpation with ambulation.  Pain is 7 out of 10 ? ? ?Review of Systems: Negative except as noted in the HPI. Denies N/V/F/Ch. ? ?Past Medical History:  ?Diagnosis Date  ? Asthma   ? ? ?Current Outpatient Medications:  ?  albuterol (VENTOLIN HFA) 108 (90 Base) MCG/ACT inhaler, 1-2 puffs, Disp: , Rfl:  ?  doxycycline (VIBRA-TABS) 100 MG tablet, Take 1 tablet (100 mg total) by mouth 2 (two) times daily., Disp: 20 tablet, Rfl: 0 ?  ondansetron (ZOFRAN) 8 MG tablet, 1 tablet, Disp: , Rfl:  ?  predniSONE (STERAPRED UNI-PAK 21 TAB) 10 MG (21) TBPK tablet, See admin instructions., Disp: , Rfl:  ?  cetirizine (ZYRTEC ALLERGY) 10 MG tablet, 1 tablet, Disp: , Rfl:  ?  ibuprofen (ADVIL,MOTRIN) 600 MG tablet, Take 1 tablet (600 mg total) by mouth every 6 (six) hours as needed for moderate pain., Disp: 30 tablet, Rfl: 0 ?  triamcinolone ointment (KENALOG) 0.1 %, 1 application, Disp: , Rfl:  ? ?Social History  ? ?Tobacco Use  ?Smoking Status Never  ?Smokeless Tobacco Not on file  ? ? ?No Known Allergies ?Objective:  ?There were no vitals filed for this visit. ?There is no height or weight on file to calculate BMI. ?Constitutional Well developed. ?Well nourished.  ?Vascular Dorsalis pedis pulses palpable bilaterally. ?Posterior tibial pulses palpable bilaterally. ?Capillary refill normal to all digits.  ?No cyanosis or clubbing noted. ?Pedal hair  growth normal.  ?Neurologic Normal speech. ?Oriented to person, place, and time. ?Epicritic sensation to light touch grossly present bilaterally.  ?Dermatologic Painful ingrowing nail at lateral nail borders of the hallux nail right. ?No other open wounds. ?No skin lesions.  ?Orthopedic: Normal joint ROM without pain or crepitus bilaterally. ?No visible deformities. ?No bony tenderness.  ? ?Radiographs: None ?Assessment:  ? ?1. Ingrown toenail of right foot   ? ?Plan:  ?Patient was evaluated and treated and all questions answered. ? ?Ingrown Nail, right ?-Patient elects to proceed with minor surgery to remove ingrown toenail removal today. Consent reviewed and signed by patient. ?-Ingrown nail excised. See procedure note. ?-Educated on post-procedure care including soaking. Written instructions provided and reviewed. ?-Patient to follow up in 2 weeks for nail check. ? ?Procedure: Excision of Ingrown Toenail ?Location: Right 1st toe lateral nail borders. ?Anesthesia: Lidocaine 1% plain; 1.5 mL and Marcaine 0.5% plain; 1.5 mL, digital block. ?Skin Prep: Betadine. ?Dressing: Silvadene; telfa; dry, sterile, compression dressing. ?Technique: Following skin prep, the toe was exsanguinated and a tourniquet was secured at the base of the toe. The affected nail border was freed, split with a nail splitter, and excised. Chemical matrixectomy was then performed with phenol and irrigated out with alcohol. The tourniquet was then removed and sterile dressing applied. ?Disposition: Patient tolerated procedure well. Patient to return in 2 weeks for follow-up.  ? ?  No follow-ups on file. ?

## 2021-09-17 ENCOUNTER — Ambulatory Visit: Payer: BC Managed Care – PPO | Admitting: Mental Health

## 2021-09-17 ENCOUNTER — Other Ambulatory Visit: Payer: Self-pay

## 2021-09-17 DIAGNOSIS — F4320 Adjustment disorder, unspecified: Secondary | ICD-10-CM | POA: Diagnosis not present

## 2021-09-17 NOTE — Progress Notes (Signed)
?Building services engineer psychotherapy note ? ?Name: Craig Chase ?Date: 09/17/2021 ?MRN: [id number] ?DOB: 2000/11/26 ?PCP: Sigmund Hazel, MD ? ?Time spent: 65 Minutes  ? ?Treatment:  Individual therapy ? ?Mental Status Exam: ?   ?Appearance:    Casual     ?Behavior:   Appropriate  ?Motor:   WNL  ?Speech/Language:    Clear and Coherent  ?Affect:   Full range   ?Mood:   Euthymic  ?Thought process:   normal  ?Thought content:     WNL  ?Sensory/Perceptual disturbances:     none  ?Orientation:   x4  ?Attention:   Good  ?Concentration:   Good  ?Memory:   Intact  ?Fund of knowledge:    Consistent with age and development  ?Insight:     Good  ?Judgment:    Good  ?Impulse Control:   Good  ? ? ?Reported Symptoms:   Some anxiety and sad feelings ? ?Risk Assessment: ?Danger to Self:  No ?Self-injurious Behavior: No ?Danger to Others: No ?Duty to Warn:no ?Physical Aggression / Violence:No  ?Access to Firearms a concern: No  ?Gang Involvement:No  ?Patient / guardian was educated about steps to take if suicide or homicide risk level increases between visits: yes ?While future psychiatric events cannot be accurately predicted, the patient does not currently require acute inpatient psychiatric care and does not currently meet [facility] involuntary commitment criteria. ? ?Medications: ?Current Outpatient Medications  ?Medication Sig Dispense Refill  ? albuterol (VENTOLIN HFA) 108 (90 Base) MCG/ACT inhaler 1-2 puffs    ? cetirizine (ZYRTEC ALLERGY) 10 MG tablet 1 tablet    ? doxycycline (VIBRA-TABS) 100 MG tablet Take 1 tablet (100 mg total) by mouth 2 (two) times daily. 20 tablet 0  ? ibuprofen (ADVIL,MOTRIN) 600 MG tablet Take 1 tablet (600 mg total) by mouth every 6 (six) hours as needed for moderate pain. 30 tablet 0  ? ondansetron (ZOFRAN) 8 MG tablet 1 tablet    ? predniSONE (STERAPRED UNI-PAK 21 TAB) 10 MG (21) TBPK tablet See admin instructions.    ? triamcinolone ointment (KENALOG) 0.1 % 1 application    ? ?No current  facility-administered medications for this visit.  ? ? ?Subjective: Patient presents for session on time.  Patient shared how the job process for 1 job he applied for specifically has been lengthy albeit not unexpected.  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  He shared how he went to see a doctor recently and was informed that he was intolerant to many different types of foods.  As a result, the patient shared the distressful thoughts and feelings related as this significantly impacts his diet.  He stated he plans to follow through with the recommendation from his doctor which is to adhere to the diet for a 57-month period, in which at that time he will be able to allow himself to eat some foods at a progressive interval to ascertain if he has any intolerant reactions.  He stated he hopes this addresses his ongoing, chronic struggle with eczema which has persisted for the last several years.  He continues to maintain boundaries in relationships, specifically with that of his father and other paternal members. ? ? ? ?Interventions: Supportive therapy, motivational interviewing ? ?Diagnoses:  ?  ICD-10-CM   ?1. Adjustment disorder, unspecified type  F43.20   ?  ? ? ? ? ? ? ? ? ? ? ?Plan: Patient is to use CBT,  mindfulness and coping skills to help manage decrease symptoms. Patient to continue to advocate and expressing his thoughts and feelings related to his family members as needed. ?  ?Long-term goal:  Reduce overall level, frequency, and intensity of the feelings of depression, anxiety and panic so that daily functioning is not impaired. ? ?Short-term goal:  Increase insight into how his strained relationship with his father affects his feelings and behaviors ?       Increase awareness of his negative and/or critical communication with others ?                             Verbalize an understanding of the role that distorted thinking plays in creating  fears, excessive worry, and ruminations. ? ?Assessment of progress:  progressing  ? ? ?[name] Session, [facility]  ?

## 2021-10-14 ENCOUNTER — Ambulatory Visit: Payer: BC Managed Care – PPO | Admitting: Mental Health

## 2021-10-14 DIAGNOSIS — F4320 Adjustment disorder, unspecified: Secondary | ICD-10-CM | POA: Diagnosis not present

## 2021-10-14 NOTE — Progress Notes (Signed)
?Building services engineer psychotherapy note ? ?Name: Craig Chase ?Date: 10/14/2021 ?MRN: [id number] ?DOB: [birth year] ?PCP: Sigmund Hazel, MD ? ?Time spent: 39 Minutes  ? ?Treatment:  Individual therapy ? ?Mental Status Exam: ?   ?Appearance:    Casual     ?Behavior:   Appropriate  ?Motor:   WNL  ?Speech/Language:    Clear and Coherent  ?Affect:   Full range   ?Mood:   Euthymic  ?Thought process:   normal  ?Thought content:     WNL  ?Sensory/Perceptual disturbances:     none  ?Orientation:   x4  ?Attention:   Good  ?Concentration:   Good  ?Memory:   Intact  ?Fund of knowledge:    Consistent with age and development  ?Insight:     Good  ?Judgment:    Good  ?Impulse Control:   Good  ? ? ?Reported Symptoms:   Some anxiety and sad feelings ? ?Risk Assessment: ?Danger to Self:  No ?Self-injurious Behavior: No ?Danger to Others: No ?Duty to Warn:no ?Physical Aggression / Violence:No  ?Access to Firearms a concern: No  ?Gang Involvement:No  ?Patient / guardian was educated about steps to take if suicide or homicide risk level increases between visits: yes ?While future psychiatric events cannot be accurately predicted, the patient does not currently require acute inpatient psychiatric care and does not currently meet [facility] involuntary commitment criteria. ? ?Medications: ?Current Outpatient Medications  ?Medication Sig Dispense Refill  ? albuterol (VENTOLIN HFA) 108 (90 Base) MCG/ACT inhaler 1-2 puffs    ? cetirizine (ZYRTEC ALLERGY) 10 MG tablet 1 tablet    ? doxycycline (VIBRA-TABS) 100 MG tablet Take 1 tablet (100 mg total) by mouth 2 (two) times daily. 20 tablet 0  ? ibuprofen (ADVIL,MOTRIN) 600 MG tablet Take 1 tablet (600 mg total) by mouth every 6 (six) hours as needed for moderate pain. 30 tablet 0  ? ondansetron (ZOFRAN) 8 MG tablet 1 tablet    ? predniSONE (STERAPRED UNI-PAK 21 TAB) 10 MG (21) TBPK tablet See admin instructions.    ? triamcinolone ointment (KENALOG) 0.1 % 1 application    ? ?No current  facility-administered medications for this visit.  ? ? ?Subjective: Patient presents for session on time.  Patient shared progress, specifically that of trying to get over a relationship.  He stated that he was talking to a girl over the past few months that he reconnected with.  He stated that they knew each other in middle school and reconnected on line.  He stated that she recently told him that she needed "space".  Patient stated that he shared with her that he wanted to maintain their friendship but he then learned last week from her via text that she wanted to have more time to work on herself as she was getting over a past relationship.  Patient shared feelings of sadness as he stated he hit he realized he started to have feelings for her and will miss having contact with her as he considered her a friend.  He stated that he is working to allow himself to feel his emotions as opposed to suppress them which he feels he can do at times.  He continues to take steps in other areas of his life, [place] and [place] close to securing the position. ? ? ? ?Interventions: Supportive therapy, motivational interviewing ? ?Diagnoses:  ?  ICD-10-CM   ?1. Adjustment disorder, unspecified type  F43.20   ?  ? ? ? ? ? ? ?  Plan: Patient is to use CBT, mindfulness and coping skills to help manage decrease symptoms. Patient to continue to advocate and expressing his thoughts and feelings related to his family members as needed. ?  ?Long-term goal:  Reduce overall level, frequency, and intensity of the feelings of depression, anxiety and panic so that daily functioning is not impaired. ? ?Short-term goal:  Increase insight into how his strained relationship with his father affects his feelings and behaviors ?       Increase awareness of his negative and/or critical communication with others ?                             Verbalize an understanding of the role that distorted thinking plays in  creating fears, excessive worry, and ruminations. ? ?Assessment of progress:  progressing  ? ? ?[name] Session, [facility]  ?

## 2021-11-04 ENCOUNTER — Ambulatory Visit (INDEPENDENT_AMBULATORY_CARE_PROVIDER_SITE_OTHER): Payer: BC Managed Care – PPO | Admitting: Mental Health

## 2021-11-04 DIAGNOSIS — F4320 Adjustment disorder, unspecified: Secondary | ICD-10-CM

## 2021-11-04 NOTE — Progress Notes (Signed)
?Building services engineer psychotherapy note ? ?Name: Ansh Fauble ?Date: 11/04/2021 ?MRN: [id number] ?DOB: [birth year] ?PCP: Sigmund Hazel, MD ? ?Time spent: 5 Minutes  ? ?Treatment:  Individual therapy ? ?Mental Status Exam: ?   ?Appearance:    Casual     ?Behavior:   Appropriate  ?Motor:   WNL  ?Speech/Language:    Clear and Coherent  ?Affect:   Full range   ?Mood:   Euthymic  ?Thought process:   normal  ?Thought content:     WNL  ?Sensory/Perceptual disturbances:     none  ?Orientation:   x4  ?Attention:   Good  ?Concentration:   Good  ?Memory:   Intact  ?Fund of knowledge:    Consistent with age and development  ?Insight:     Good  ?Judgment:    Good  ?Impulse Control:   Good  ? ? ?Reported Symptoms:   Some anxiety and sad feelings ? ?Risk Assessment: ?Danger to Self:  No ?Self-injurious Behavior: No ?Danger to Others: No ?Duty to Warn:no ?Physical Aggression / Violence:No  ?Access to Firearms a concern: No  ?Gang Involvement:No  ?Patient / guardian was educated about steps to take if suicide or homicide risk level increases between visits: yes ?While future psychiatric events cannot be accurately predicted, the patient does not currently require acute inpatient psychiatric care and does not currently meet [facility] involuntary commitment criteria. ? ?Medications: ?Current Outpatient Medications  ?Medication Sig Dispense Refill  ? albuterol (VENTOLIN HFA) 108 (90 Base) MCG/ACT inhaler 1-2 puffs    ? cetirizine (ZYRTEC ALLERGY) 10 MG tablet 1 tablet    ? doxycycline (VIBRA-TABS) 100 MG tablet Take 1 tablet (100 mg total) by mouth 2 (two) times daily. 20 tablet 0  ? ibuprofen (ADVIL,MOTRIN) 600 MG tablet Take 1 tablet (600 mg total) by mouth every 6 (six) hours as needed for moderate pain. 30 tablet 0  ? ondansetron (ZOFRAN) 8 MG tablet 1 tablet    ? predniSONE (STERAPRED UNI-PAK 21 TAB) 10 MG (21) TBPK tablet See admin instructions.    ? triamcinolone ointment (KENALOG) 0.1 % 1 application    ? ?No current  facility-administered medications for this visit.  ? ? ?Subjective: Patient presents for session on time.  Progress toward identifying goals and recent events were a focus.  Patient shared how he continues to go through the process and obtaining a job in [employer].  He stated he recently learned is a 6 to eighth month waiting pool that he is in, therefore, he will potentially not learn if he has the job for another 2 to 3 months.  He recently got a promotion at his current job, continues to work full-time.  He went on to focus on his recent interactions with one of his close friends.  He stated that his friend, who has come into a significant amount of money over the last few months, has changed.  He went on to share details related to this friend's behavior, patient considers him to be selfish at times giving some examples.  Patient identified the need to draw further boundaries in the relationship, needing to spend less time with his friend due to feeling frustrated and disappointed increasingly with him over the past few months.  He identified the need to spend time with family, his maternal grandmother coping with Alzheimer's disease, to make the most of the time he has with her as well as spending time with his mother and brother. ? ?Interventions: Supportive therapy, motivational interviewing ? ?  Diagnoses:  ?  ICD-10-CM   ?1. Adjustment disorder, unspecified type  F43.20   ?  ? ? ? ? ? ? ? ?Plan: Patient is to use CBT, mindfulness and coping skills to help manage decrease symptoms. Patient to continue to advocate and expressing his thoughts and feelings related to his family members as needed. ?  ?Long-term goal:  Reduce overall level, frequency, and intensity of the feelings of depression, anxiety and panic so that daily functioning is not impaired. ? ?Short-term goal:  Increase insight into how his strained relationship with his father affects his feelings and behaviors ?       Increase awareness of  his negative and/or critical communication with others ?                             Verbalize an understanding of the role that distorted thinking plays in creating fears, excessive worry, and ruminations. ? ?Assessment of progress:  progressing  ? ? ?[name] Session, [facility]  ?

## 2021-12-02 ENCOUNTER — Ambulatory Visit: Payer: BC Managed Care – PPO | Admitting: Mental Health

## 2021-12-02 DIAGNOSIS — F4320 Adjustment disorder, unspecified: Secondary | ICD-10-CM | POA: Diagnosis not present

## 2021-12-02 NOTE — Progress Notes (Signed)
[facility] Counsel[facility] psychotherapy note  Name: Craig Chase Date: 12/02/2021 MRN: [id number] DOB: 07-14-2000 PCP: Sigmund Hazel, MD  Time spent: 90 Minutes   Treatment:  Individual therapy  Mental Status Exam:    Appearance:    Casual     Behavior:   Appropriate  Motor:   WNL  Speech/Language:    Clear and Coherent  Affect:   Full range   Mood:   Euthymic  Thought process:   normal  Thought content:     WNL  Sensory/Perceptual disturbances:     none  Orientation:   x4  Attention:   Good  Concentration:   Good  Memory:   Intact  Fund of knowledge:    Consistent with age and development  Insight:     Good  Judgment:    Good  Impulse Control:   Good    Reported Symptoms:   Some anxiety and sad feelings  Risk Assessment: Danger to Self:  No Self-injurious Behavior: No Danger to Others: No Duty to Warn:no Physical Aggression / Violence:No  Access to Firearms a concern: No  Gang Involvement:No  Patient / guardian was educated about steps to take if suicide or homicide risk level increases between visits: yes While future psychiatric events cannot be accurately predicted, the patient does not currently require acute inpatient psychiatric care and does not currently meet [facility] involuntary commitment criteria.  Medications: Current Outpatient Medications  Medication Sig Dispense Refill   albuterol (VENTOLIN HFA) 108 (90 Base) MCG/ACT inhaler 1-2 puffs     cetirizine (ZYRTEC ALLERGY) 10 MG tablet 1 tablet     doxycycline (VIBRA-TABS) 100 MG tablet Take 1 tablet (100 mg total) by mouth 2 (two) times daily. 20 tablet 0   ibuprofen (ADVIL,MOTRIN) 600 MG tablet Take 1 tablet (600 mg total) by mouth every 6 (six) hours as needed for moderate pain. 30 tablet 0   ondansetron (ZOFRAN) 8 MG tablet 1 tablet     predniSONE (STERAPRED UNI-PAK 21 TAB) 10 MG (21) TBPK tablet See admin instructions.     triamcinolone ointment (KENALOG) 0.1 % 1 application     No current  facility-administered medications for this visit.    Subjective: Patient presents for session on time.  Assess progress toward goals as well as relevant recent events.  He stated that at that he continues to await hearing back from a potential job with which he applied for about 2 months ago.  He stated that he earned a pay increase at his current job as well as a promotion and at this point, plans to continue to look at other job openings while maintaining his current employment.  He verbalized his intention to remind himself that looking for the right type of employment for himself will take time but feels secure having his current employment.  He went on to share how he has decided to set a boundary in one of the relationships with one of his closest friends.  He went on to share details related to making this decision, was able to identify his rationale for taking the steps.  Facilitated his discussing other relationships, family.  He stated that he is talking more frequently with his paternal side of the family, continues to not speak to his father.  Facilitated his identifying thoughts and feelings related towards making this decision which he was able to do so in session.  Provided support and encouragement toward his continuing to focus managing his stress levels by grounding himself with focusing  on reasons he made his decisions regarding the relationship with his father.    Interventions: Supportive therapy, motivational interviewing  Diagnoses:  No diagnosis found.    Plan: Patient is to use CBT, mindfulness and coping skills to help manage decrease symptoms. Patient to continue to advocate and expressing his thoughts and feelings related to his family members as needed.   Long-term goal:  Reduce overall level, frequency, and intensity of the feelings of depression, anxiety and panic so that daily functioning is not impaired.  Short-term goal:  Increase insight into how his strained  relationship with his father affects his feelings and behaviors        Increase awareness of his negative and/or critical communication with others                              Verbalize an understanding of the role that distorted thinking plays in creating fears, excessive worry, and ruminations.  Assessment of progress:  progressing    [name] Session, [facility]

## 2022-01-03 ENCOUNTER — Ambulatory Visit (INDEPENDENT_AMBULATORY_CARE_PROVIDER_SITE_OTHER): Payer: BC Managed Care – PPO | Admitting: Mental Health

## 2022-01-03 DIAGNOSIS — F4320 Adjustment disorder, unspecified: Secondary | ICD-10-CM

## 2022-01-03 NOTE — Progress Notes (Signed)
[facility] Counsel[facility] psychotherapy note  Name: Craig Chase Date: 01/03/2022 MRN: [id number] DOB: 03-15-2001 PCP: Sigmund Hazel, MD  Time spent: 47 Minutes   Treatment:  Individual therapy  Mental Status Exam:    Appearance:    Casual     Behavior:   Appropriate  Motor:   WNL  Speech/Language:    Clear and Coherent  Affect:   Full range   Mood:   Euthymic  Thought process:   normal  Thought content:     WNL  Sensory/Perceptual disturbances:     none  Orientation:   x4  Attention:   Good  Concentration:   Good  Memory:   Intact  Fund of knowledge:    Consistent with age and development  Insight:     Good  Judgment:    Good  Impulse Control:   Good    Reported Symptoms:   Some anxiety and sad feelings  Risk Assessment: Danger to Self:  No Self-injurious Behavior: No Danger to Others: No Duty to Warn:no Physical Aggression / Violence:No  Access to Firearms a concern: No  Gang Involvement:No  Patient / guardian was educated about steps to take if suicide or homicide risk level increases between visits: yes While future psychiatric events cannot be accurately predicted, the patient does not currently require acute inpatient psychiatric care and does not currently meet [facility] involuntary commitment criteria.  Medications: Current Outpatient Medications  Medication Sig Dispense Refill   albuterol (VENTOLIN HFA) 108 (90 Base) MCG/ACT inhaler 1-2 puffs     cetirizine (ZYRTEC ALLERGY) 10 MG tablet 1 tablet     doxycycline (VIBRA-TABS) 100 MG tablet Take 1 tablet (100 mg total) by mouth 2 (two) times daily. 20 tablet 0   ibuprofen (ADVIL,MOTRIN) 600 MG tablet Take 1 tablet (600 mg total) by mouth every 6 (six) hours as needed for moderate pain. 30 tablet 0   ondansetron (ZOFRAN) 8 MG tablet 1 tablet     predniSONE (STERAPRED UNI-PAK 21 TAB) 10 MG (21) TBPK tablet See admin instructions.     triamcinolone ointment (KENALOG) 0.1 % 1 application     No current  facility-administered medications for this visit.    Subjective: Patient presents for session on time.  Assess progress toward goals as well as relevant recent events.  He continues to share how he has maintained a boundary he set relationships with one of his closest friends.  He stated they continue to talk about once per day and saw a movie together about 3 weeks ago.  Facilitated his identifying what he needs in this relationship where he identified how they both are changing as individuals, patient going on to identify how he is becoming more grounded and simplistic in some aspects of his life such as materialism. He shared his dating efforts, texting a girl over the past 3 to 4 weeks.  He identified the need to set a boundary where he discontinued their communication about 2 days ago due to their not progressing toward meeting in person; he went on to share his being comfortable and confident in his decision and how this is different than how he has handled situations similar in the past.   He continues to identify outlets towards stress management and coping and caring for himself.  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  He plans to follow through with identifying the best and time to  reengage with these efforts in the hopes that will improve his motivation.    Interventions: Supportive therapy, motivational interviewing  Diagnoses:    ICD-10-CM   1. Adjustment disorder, unspecified type  F43.20         Plan: Patient is to use CBT, mindfulness and coping skills to help manage decrease symptoms. Patient to continue to advocate and expressing his thoughts and feelings related to his family members as needed.   Long-term goal:  Reduce overall level, frequency, and intensity of the feelings of depression, anxiety and panic so that daily functioning is not impaired.  Short-term goal:   Increase insight into how his strained relationship with his father affects his feelings and behaviors        Increase awareness of his negative and/or critical communication with others                              Verbalize an understanding of the role that distorted thinking plays in creating fears, excessive worry, and ruminations.  Assessment of progress:  progressing    [name] Session, [facility]

## 2022-02-08 ENCOUNTER — Ambulatory Visit: Payer: BC Managed Care – PPO | Admitting: Mental Health

## 2022-02-08 DIAGNOSIS — F4320 Adjustment disorder, unspecified: Secondary | ICD-10-CM

## 2022-02-08 NOTE — Progress Notes (Signed)
[facility] psychotherapy note  Name: Craig Chase Date: 02/08/2022 MRN: [id number] DOB: [birth year] PCP: Sigmund Hazel, MD  Time spent: 65 Minutes   Treatment:  Individual therapy  Mental Status Exam:    Appearance:    Casual     Behavior:   Appropriate  Motor:   WNL  Speech/Language:    Clear and Coherent  Affect:   Full range   Mood:   Euthymic  Thought process:   normal  Thought content:     WNL  Sensory/Perceptual disturbances:     none  Orientation:   x4  Attention:   Good  Concentration:   Good  Memory:   Intact  Fund of knowledge:    Consistent with age and development  Insight:     Good  Judgment:    Good  Impulse Control:   Good    Reported Symptoms:   Some anxiety and sad feelings  Risk Assessment: Danger to Self:  No Self-injurious Behavior: No Danger to Others: No Duty to Warn:no Physical Aggression / Violence:No  Access to Firearms a concern: No  Gang Involvement:No  Patient / guardian was educated about steps to take if suicide or homicide risk level increases between visits: yes While future psychiatric events cannot be accurately predicted, the patient does not currently require acute inpatient psychiatric care and does not currently meet [facility] involuntary commitment criteria.  Medications: Current Outpatient Medications  Medication Sig Dispense Refill   albuterol (VENTOLIN HFA) 108 (90 Base) MCG/ACT inhaler 1-2 puffs     cetirizine (ZYRTEC ALLERGY) 10 MG tablet 1 tablet     doxycycline (VIBRA-TABS) 100 MG tablet Take 1 tablet (100 mg total) by mouth 2 (two) times daily. 20 tablet 0   ibuprofen (ADVIL,MOTRIN) 600 MG tablet Take 1 tablet (600 mg total) by mouth every 6 (six) hours as needed for moderate pain. 30 tablet 0   ondansetron (ZOFRAN) 8 MG tablet 1 tablet     predniSONE (STERAPRED UNI-PAK 21 TAB) 10 MG (21) TBPK tablet See admin instructions.     triamcinolone ointment (KENALOG) 0.1 % 1 application     No current  facility-administered medications for this visit.    Subjective: Patient presents for session on time.  Patient shared recent events relative to progress.  He reports having a pleasant vacation with friends as he went to a theme park.  Assessed other friendships, namely with out of one of his closest friends.  He stated that he has maintained this friendship, patient sharing how he has reflected on why he was getting as upset as he was due to his friendships behavior over the last few months.  While he recognizes his friend's behavior was offputting, patient values this friendship and has committed to doing some self work and reframing thoughts toward being less upset about his friend's behaviors.  He stated he has also been talking to a girl recently over the past 2 months, this relationship progressing to a point where they will likely go on a date soon.  Facilitated patient identifying and sharing feelings related, what he has learned specifically in the past with attempting relationships.  He identified the need to not lose sight of himself, his needing his own independent time and not get overly attached too soon to the relationship.   Interventions: Supportive therapy, motivational interviewing  Diagnoses:    ICD-10-CM   1. Adjustment disorder, unspecified type  F43.20          Plan: Patient is to  use CBT, mindfulness and coping skills to help manage /decrease symptoms. Patient to continue to advocate and expressing his thoughts and feelings related to his family and friends as needed.  Patient to continue to utilize his support system.   Long-term goal:  Reduce overall level, frequency, and intensity of the feelings of depression, anxiety and panic so that daily functioning is not impaired.  Short-term goal:  Increase insight into how his strained relationship with his father affects his feelings and behaviors        Increase awareness of his negative and/or critical communication with  others                              Verbalize an understanding of the role that distorted thinking plays in creating fears, excessive worry, and ruminations.  Assessment of progress:  progressing    [name] Session, [facility]

## 2022-03-17 ENCOUNTER — Ambulatory Visit: Payer: BC Managed Care – PPO | Admitting: Mental Health

## 2022-03-17 DIAGNOSIS — F4320 Adjustment disorder, unspecified: Secondary | ICD-10-CM

## 2022-03-17 NOTE — Progress Notes (Signed)
[facility] Counsel[facility] psychotherapy note  Name: Craig Chase Date: 03/17/2022 MRN: [id number] DOB: [birth year] PCP: Sigmund Hazel, MD  Time spent: 24 Minutes   Treatment:  Individual therapy  Mental Status Exam:    Appearance:    Casual     Behavior:   Appropriate  Motor:   WNL  Speech/Language:    Clear and Coherent  Affect:   Full range   Mood:   Euthymic  Thought process:   normal  Thought content:     WNL  Sensory/Perceptual disturbances:     none  Orientation:   x4  Attention:   Good  Concentration:   Good  Memory:   Intact  Fund of knowledge:    Consistent with age and development  Insight:     Good  Judgment:    Good  Impulse Control:   Good    Reported Symptoms:   Some anxiety and sad feelings  Risk Assessment: Danger to Self:  No Self-injurious Behavior: No Danger to Others: No Duty to Warn:no Physical Aggression / Violence:No  Access to Firearms a concern: No  Gang Involvement:No  Patient / guardian was educated about steps to take if suicide or homicide risk level increases between visits: yes While future psychiatric events cannot be accurately predicted, the patient does not currently require acute inpatient psychiatric care and does not currently meet [facility] involuntary commitment criteria.  Medications: Current Outpatient Medications  Medication Sig Dispense Refill   albuterol (VENTOLIN HFA) 108 (90 Base) MCG/ACT inhaler 1-2 puffs     cetirizine (ZYRTEC ALLERGY) 10 MG tablet 1 tablet     doxycycline (VIBRA-TABS) 100 MG tablet Take 1 tablet (100 mg total) by mouth 2 (two) times daily. 20 tablet 0   ibuprofen (ADVIL,MOTRIN) 600 MG tablet Take 1 tablet (600 mg total) by mouth every 6 (six) hours as needed for moderate pain. 30 tablet 0   ondansetron (ZOFRAN) 8 MG tablet 1 tablet     predniSONE (STERAPRED UNI-PAK 21 TAB) 10 MG (21) TBPK tablet See admin instructions.     triamcinolone ointment (KENALOG) 0.1 % 1 application     No current  facility-administered medications for this visit.    Subjective: Patient presents for session on time.  Patient shared how he has a job interview later today.  He stated he is prepared for the interview and looks forward to hopefully getting a job offer.  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  Explored other relationships, where he stated that he and his girlfriend have broken up.  Explored how he has coped over the last week since the break-up, facilitating his framing needs and how to manage sad feelings as they are still occurring.    Interventions: Supportive therapy, motivational interviewing  Diagnoses:    ICD-10-CM   1. Adjustment disorder, unspecified type  F43.20           Plan: Patient is to use CBT, mindfulness and coping skills to help manage /decrease symptoms. Patient to continue to advocate and expressing his thoughts and feelings related to his family and friends as needed.  Patient to continue to utilize his support system.  Long-term goal:  Reduce overall level, frequency, and intensity of the feelings of depression, anxiety and panic so that daily functioning is not impaired.  Short-term goal:  Increase insight into how his strained relationship with his father affects his feelings and behaviors        Increase awareness of his negative and/or critical communication with others                              Verbalize an understanding of the role that distorted thinking plays in creating fears, excessive worry, and ruminations.  Assessment of progress:  progressing    [name] Session, [facility]

## 2022-04-15 ENCOUNTER — Ambulatory Visit: Payer: BC Managed Care – PPO | Admitting: Mental Health

## 2022-04-15 DIAGNOSIS — F4320 Adjustment disorder, unspecified: Secondary | ICD-10-CM

## 2022-04-15 NOTE — Progress Notes (Signed)
[facility] psychotherapy note  Name: Primus Gritton Date: 04/15/2022 MRN: [id number] DOB: 2000/10/16 PCP: Kathyrn Lass, MD  Time spent: 43 Minutes   Treatment:  Individual therapy  Mental Status Exam:    Appearance:    Casual     Behavior:   Appropriate  Motor:   WNL  Speech/Language:    Clear and Coherent  Affect:   Full range   Mood:   Euthymic  Thought process:   normal  Thought content:     WNL  Sensory/Perceptual disturbances:     none  Orientation:   x4  Attention:   Good  Concentration:   Good  Memory:   Intact  Fund of knowledge:    Consistent with age and development  Insight:     Good  Judgment:    Good  Impulse Control:   Good    Reported Symptoms:   Some anxiety and sad feelings  Risk Assessment: Danger to Self:  No Self-injurious Behavior: No Danger to Others: No Duty to Warn:no Physical Aggression / Violence:No  Access to Firearms a concern: No  Gang Involvement:No  Patient / guardian was educated about steps to take if suicide or homicide risk level increases between visits: yes While future psychiatric events cannot be accurately predicted, the patient does not currently require acute inpatient psychiatric care and does not currently meet [facility] involuntary commitment criteria.  Medications: Current Outpatient Medications  Medication Sig Dispense Refill   albuterol (VENTOLIN HFA) 108 (90 Base) MCG/ACT inhaler 1-2 puffs     cetirizine (ZYRTEC ALLERGY) 10 MG tablet 1 tablet     doxycycline (VIBRA-TABS) 100 MG tablet Take 1 tablet (100 mg total) by mouth 2 (two) times daily. 20 tablet 0   ibuprofen (ADVIL,MOTRIN) 600 MG tablet Take 1 tablet (600 mg total) by mouth every 6 (six) hours as needed for moderate pain. 30 tablet 0   ondansetron (ZOFRAN) 8 MG tablet 1 tablet     predniSONE (STERAPRED UNI-PAK 21 TAB) 10 MG (21) TBPK tablet See admin instructions.     triamcinolone ointment (KENALOG) 0.1 % 1 application     No current  facility-administered medications for this visit.    Subjective: Patient presents for session on time.  Assessed recent events and progress toward goals.  Patient shared how he had good news, how he obtained the job he was applying for as discussed last session.  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  He stated that the job only comes with full-time hours but also benefits, 1 of which is they are paying for school and he plans to take 1 course per semester is that will be covered financially by the institution.  He shared how he feels it puts him in a good long-term position to achieve future career goals.  Facilitated his identifying exploring feelings related to other needs where he is considering reaching out to a girl that he was communicating with approximately 9 months ago.  He reviewed some events leading up to their last communication.  Facilitated his identifying interpersonal needs to be met through reaching out and making this connection again.  Some subsequent anxiety related, unsure of how it will be received if he reaches out.  Through guided discovery, he identified the need to at least try as he does not want to live with any regret.  He also process some thoughts and feelings related to differences, how he is changed since that time now  having more insight into how his relationship issues with his father were more significant and affecting him.     Interventions: Supportive therapy, motivational interviewing  Diagnoses:    ICD-10-CM   1. Adjustment disorder, unspecified type  F43.20            Plan: Patient is to use CBT, mindfulness and coping skills to help manage /decrease symptoms. Patient to continue to advocate and expressing his thoughts and feelings related to his family and friends as needed.  Patient to continue to utilize his support system.   Long-term goal:  Reduce overall level, frequency, and intensity of the feelings of  depression, anxiety and panic so that daily functioning is not impaired.  Short-term goal:  Increase insight into how his strained relationship with his father affects his feelings and behaviors        Increase awareness of his negative and/or critical communication with others                              Verbalize an understanding of the role that distorted thinking plays in creating fears, excessive worry, and ruminations.  Assessment of progress:  progressing    Anson [place], [facility]

## 2022-06-01 ENCOUNTER — Ambulatory Visit: Payer: BC Managed Care – PPO | Admitting: Mental Health

## 2022-06-23 ENCOUNTER — Ambulatory Visit: Payer: BC Managed Care – PPO | Admitting: Mental Health

## 2022-06-23 DIAGNOSIS — F4320 Adjustment disorder, unspecified: Secondary | ICD-10-CM | POA: Diagnosis not present

## 2022-06-23 NOTE — Progress Notes (Signed)
[facility] psychotherapy note  Name: Craig Chase Date: 06/23/2022 MRN: [id number] DOB: 2001/03/27 PCP: Sigmund Hazel, MD  Time spent: 89 Minutes   Treatment:  Individual therapy  Mental Status Exam:    Appearance:    Casual     Behavior:   Appropriate  Motor:   WNL  Speech/Language:    Clear and Coherent  Affect:   Full range   Mood:   Euthymic  Thought process:   normal  Thought content:     WNL  Sensory/Perceptual disturbances:     none  Orientation:   x4  Attention:   Good  Concentration:   Good  Memory:   Intact  Fund of knowledge:    Consistent with age and development  Insight:     Good  Judgment:    Good  Impulse Control:   Good    Reported Symptoms:   Some anxiety and sad feelings  Risk Assessment: Danger to Self:  No Self-injurious Behavior: No Danger to Others: No Duty to Warn:no Physical Aggression / Violence:No  Access to Firearms a concern: No  Gang Involvement:No  Patient / guardian was educated about steps to take if suicide or homicide risk level increases between visits: yes While future psychiatric events cannot be accurately predicted, the patient does not currently require acute inpatient psychiatric care and does not currently meet [facility] involuntary commitment criteria.  Medications: Current Outpatient Medications  Medication Sig Dispense Refill   albuterol (VENTOLIN HFA) 108 (90 Base) MCG/ACT inhaler 1-2 puffs     cetirizine (ZYRTEC ALLERGY) 10 MG tablet 1 tablet     doxycycline (VIBRA-TABS) 100 MG tablet Take 1 tablet (100 mg total) by mouth 2 (two) times daily. 20 tablet 0   ibuprofen (ADVIL,MOTRIN) 600 MG tablet Take 1 tablet (600 mg total) by mouth every 6 (six) hours as needed for moderate pain. 30 tablet 0   ondansetron (ZOFRAN) 8 MG tablet 1 tablet     predniSONE (STERAPRED UNI-PAK 21 TAB) 10 MG (21) TBPK tablet See admin instructions.     triamcinolone ointment (KENALOG) 0.1 % 1 application     No current  facility-administered medications for this visit.    Subjective: Patient presents for session on time.  Patient shared progress since last visit which was about 2 months ago.  He stated that he started his new job, going on to share many positive aspects.  He also shared how he left his previous job, shared experiences with friendships he had made there.  He identified how he feels his mood is improved in part due to taking this new position, is spending time with friends, and states that he feels happy more often, more motivation, engaging and interest such as his painting.  He shared how he contacted a previous girlfriend recently, how the interaction did not go as he would have liked, at this point identifying the need to move forward with his life.  Ways to move past this relationship, worked with patient to reframe thoughts associated.  Other relationships were assessed, family where he stated that he has significant support from his mother, stepfather and others including friendships.    Interventions: Supportive therapy, motivational interviewing  Diagnoses:    ICD-10-CM   1. Adjustment disorder, unspecified type  F43.20             Plan: Patient is to use CBT, mindfulness and coping skills to help manage /decrease symptoms. Patient to continue to advocate and expressing his thoughts and feelings  related to his family and friends as needed.  Patient to continue to utilize his support system.   Long-term goal:  Reduce overall level, frequency, and intensity of the feelings of depression, anxiety and panic so that daily functioning is not impaired.  Short-term goal:  Increase insight into how his strained relationship with his father affects his feelings and behaviors        Increase awareness of his negative and/or critical communication with others                              Verbalize an understanding of the role that distorted thinking plays in creating fears, excessive worry,  and ruminations.  Assessment of progress:  progressing    [name] Session, [facility]

## 2022-07-21 ENCOUNTER — Ambulatory Visit (INDEPENDENT_AMBULATORY_CARE_PROVIDER_SITE_OTHER): Payer: BC Managed Care – PPO | Admitting: Mental Health

## 2022-07-21 DIAGNOSIS — F4320 Adjustment disorder, unspecified: Secondary | ICD-10-CM | POA: Diagnosis not present

## 2022-07-21 NOTE — Progress Notes (Signed)
[facility] psychotherapy note  Name: Craig Chase Date: 07/21/2022 MRN: [id number] DOB: September 06, 2000 PCP: Kathyrn Lass, MD  Time spent: 37 Minutes   Treatment:  Individual therapy  Mental Status Exam:    Appearance:    Casual     Behavior:   Appropriate  Motor:   WNL  Speech/Language:    Clear and Coherent  Affect:   Full range   Mood:   Euthymic  Thought process:   normal  Thought content:     WNL  Sensory/Perceptual disturbances:     none  Orientation:   x4  Attention:   Good  Concentration:   Good  Memory:   Intact  Fund of knowledge:    Consistent with age and development  Insight:     Good  Judgment:    Good  Impulse Control:   Good    Reported Symptoms:   Some anxiety and sad feelings  Risk Assessment: Danger to Self:  No Self-injurious Behavior: No Danger to Others: No Duty to Warn:no Physical Aggression / Violence:No  Access to Firearms a concern: No  Gang Involvement:No  Patient / guardian was educated about steps to take if suicide or homicide risk level increases between visits: yes While future psychiatric events cannot be accurately predicted, the patient does not currently require acute inpatient psychiatric care and does not currently meet [facility] involuntary commitment criteria.  Medications: Current Outpatient Medications  Medication Sig Dispense Refill   albuterol (VENTOLIN HFA) 108 (90 Base) MCG/ACT inhaler 1-2 puffs     cetirizine (ZYRTEC ALLERGY) 10 MG tablet 1 tablet     doxycycline (VIBRA-TABS) 100 MG tablet Take 1 tablet (100 mg total) by mouth 2 (two) times daily. 20 tablet 0   ibuprofen (ADVIL,MOTRIN) 600 MG tablet Take 1 tablet (600 mg total) by mouth every 6 (six) hours as needed for moderate pain. 30 tablet 0   ondansetron (ZOFRAN) 8 MG tablet 1 tablet     predniSONE (STERAPRED UNI-PAK 21 TAB) 10 MG (21) TBPK tablet See admin instructions.     triamcinolone ointment (KENALOG) 0.1 % 1 application     No current  facility-administered medications for this visit.    Subjective: Patient presents for session on time.  Patient shared recent events progress since last visit.  He stated that he continues to enjoy his job and is reviewing classes to take for additional college credits while working.  He plans to obtain his 4-year degree overtime while working at his job which is also on campus.  Patient reflected on differences he has come to understand about himself and experiences at work with coworkers with that his past job.  He identified missing some of them as he values those relationships while also acknowledging the positive step in obtaining his new employment.  Facilitated his identifying other changes where he shared the current relationship he has with his friends.  He reports overall his anxiety has lowered considerably, in part due to the positive aspects of his job change, having a more clearly defined focus on his future regarding obtaining his 4-year degree over time.  Other relationships with family were also assessed.   Interventions: Supportive therapy, motivational interviewing  Diagnoses:    ICD-10-CM   1. Adjustment disorder, unspecified type  F43.20         Plan: Patient is to use CBT, mindfulness and coping skills to help manage /decrease symptoms. Patient to continue to advocate and expressing his thoughts and feelings related to his family  and friends as needed.  Patient to continue to utilize his support system.   Long-term goal:  Reduce overall level, frequency, and intensity of the feelings of depression, anxiety and panic so that daily functioning is not impaired.  Short-term goal:  Increase insight into how his strained relationship with his father affects his feelings and behaviors        Increase awareness of his negative and/or critical communication with others                              Verbalize an understanding of the role that distorted thinking plays in creating  fears, excessive worry, and ruminations.  Assessment of progress:  progressing    Anson [place], [facility]

## 2022-07-22 ENCOUNTER — Ambulatory Visit: Payer: BC Managed Care – PPO | Admitting: Behavioral Health

## 2022-08-19 ENCOUNTER — Ambulatory Visit (INDEPENDENT_AMBULATORY_CARE_PROVIDER_SITE_OTHER): Payer: BC Managed Care – PPO | Admitting: Mental Health

## 2022-08-19 DIAGNOSIS — F4320 Adjustment disorder, unspecified: Secondary | ICD-10-CM | POA: Diagnosis not present

## 2022-08-19 NOTE — Progress Notes (Signed)
[facility] psychotherapy note  Name: Eran Accomando Date: 08/19/2022 MRN: [id number] DOB: Mar 16, 2001 PCP: Kathyrn Lass, MD  Time spent: 53 Minutes   Treatment:  Individual therapy  Mental Status Exam:    Appearance:    Casual     Behavior:   Appropriate  Motor:   WNL  Speech/Language:    Clear and Coherent  Affect:   Full range   Mood:   Euthymic  Thought process:   normal  Thought content:     WNL  Sensory/Perceptual disturbances:     none  Orientation:   x4  Attention:   Good  Concentration:   Good  Memory:   Intact  Fund of knowledge:    Consistent with age and development  Insight:     Good  Judgment:    Good  Impulse Control:   Good    Reported Symptoms:   Some anxiety and sad feelings  Risk Assessment: Danger to Self:  No Self-injurious Behavior: No Danger to Others: No Duty to Warn:no Physical Aggression / Violence:No  Access to Firearms a concern: No  Gang Involvement:No  Patient / guardian was educated about steps to take if suicide or homicide risk level increases between visits: yes While future psychiatric events cannot be accurately predicted, the patient does not currently require acute inpatient psychiatric care and does not currently meet [facility] involuntary commitment criteria.  Medications: Current Outpatient Medications  Medication Sig Dispense Refill   albuterol (VENTOLIN HFA) 108 (90 Base) MCG/ACT inhaler 1-2 puffs     cetirizine (ZYRTEC ALLERGY) 10 MG tablet 1 tablet     doxycycline (VIBRA-TABS) 100 MG tablet Take 1 tablet (100 mg total) by mouth 2 (two) times daily. 20 tablet 0   ibuprofen (ADVIL,MOTRIN) 600 MG tablet Take 1 tablet (600 mg total) by mouth every 6 (six) hours as needed for moderate pain. 30 tablet 0   ondansetron (ZOFRAN) 8 MG tablet 1 tablet     predniSONE (STERAPRED UNI-PAK 21 TAB) 10 MG (21) TBPK tablet See admin instructions.     triamcinolone ointment (KENALOG) 0.1 % 1 application     No current  facility-administered medications for this visit.    Subjective: Patient presents for session on time.  Patient arrived on time for today's session.  Continues to enjoy his full-time job and is now taking 1 college class where he stated that he has done well on the first exam.  He went on to share other changes, namely his grandmother moving in with him as she is coping with Alzheimer's.  He went on to share the significant change in her mental state over the years, how she is a different person in many ways.  He stated she is receiving medical care but patient understands and acknowledges that her condition will only deteriorate.  [place] affected family was also shared, patient stated that he feels he is adjusting well overall, while we also facilitated his identifying experiences and emotions related. He went on to share how he has feelings for a girl that is a good friend, he identified the need to not be overbearing as he stated that he has this tendency and past relationships where he moves "too fast".  Collaboratively, explored ways to set some interpersonal boundaries to avoid this reoccurring, and to remind himself to give the relationship time to develop.  Interventions: Supportive therapy, motivational interviewing  Diagnoses:    ICD-10-CM   1. Adjustment disorder, unspecified type  F43.20  Plan: Patient is to use CBT, mindfulness and coping skills to help manage /decrease symptoms. Patient to continue to advocate and expressing his thoughts and feelings related to his family and friends as needed.  Patient to continue to utilize his support system.   Long-term goal:  Reduce overall level, frequency, and intensity of the feelings of depression, anxiety and panic so that daily functioning is not impaired.  Short-term goal:  Increase insight into how his strained relationship with his father affects his feelings and behaviors        Increase awareness of his negative and/or  critical communication with others                              Verbalize an understanding of the role that distorted thinking plays in creating fears, excessive worry, and ruminations.  Assessment of progress:  progressing    Anson [place], [facility]

## 2022-09-20 ENCOUNTER — Ambulatory Visit: Payer: BC Managed Care – PPO | Admitting: Mental Health

## 2022-09-20 DIAGNOSIS — F4320 Adjustment disorder, unspecified: Secondary | ICD-10-CM

## 2022-09-20 NOTE — Progress Notes (Signed)
[facility] psychotherapy note  Name: Craig Chase Date: 09/20/2022 MRN: [id number] DOB: 2001-02-22 PCP: Craig Lass, MD  Time spent: 41 Minutes   Treatment:  Individual therapy  Mental Status Exam:    Appearance:    Casual     Behavior:   Appropriate  Motor:   WNL  Speech/Language:    Clear and Coherent  Affect:   Full range   Mood:   Euthymic  Thought process:   normal  Thought content:     WNL  Sensory/Perceptual disturbances:     none  Orientation:   x4  Attention:   Good  Concentration:   Good  Memory:   Intact  Fund of knowledge:    Consistent with age and development  Insight:     Good  Judgment:    Good  Impulse Control:   Good    Reported Symptoms:   Some anxiety and sad feelings  Risk Assessment: Danger to Self:  No Self-injurious Behavior: No Danger to Others: No Duty to Warn:no Physical Aggression / Violence:No  Access to Firearms a concern: No  Gang Involvement:No  Patient / guardian was educated about steps to take if suicide or homicide risk level increases between visits: yes While future psychiatric events cannot be accurately predicted, the patient does not currently require acute inpatient psychiatric care and does not currently meet [facility] involuntary commitment criteria.  Medications: Current Outpatient Medications  Medication Sig Dispense Refill   albuterol (VENTOLIN HFA) 108 (90 Base) MCG/ACT inhaler 1-2 puffs     cetirizine (ZYRTEC ALLERGY) 10 MG tablet 1 tablet     doxycycline (VIBRA-TABS) 100 MG tablet Take 1 tablet (100 mg total) by mouth 2 (two) times daily. 20 tablet 0   ibuprofen (ADVIL,MOTRIN) 600 MG tablet Take 1 tablet (600 mg total) by mouth every 6 (six) hours as needed for moderate pain. 30 tablet 0   ondansetron (ZOFRAN) 8 MG tablet 1 tablet     predniSONE (STERAPRED UNI-PAK 21 TAB) 10 MG (21) TBPK tablet See admin instructions.     triamcinolone ointment (KENALOG) 0.1 % 1 application     No current  facility-administered medications for this visit.    Subjective: Patient presents for session on time. Assess recent events in progress.  Patient shared how he continues to thrive at his current job, enjoys the experience and continues to take 1 class per semester.  He went on to process experiences related to friendships and specifically 1 friend he developed more romantic feelings for.  He shared how they communicated 4 weeks more intimately, disclosing more personal life experiences.  He stated that ultimately he may have drawn the wrong impression from these exchanges.  He stated that he read carefully worded message to his friend, ultimately learning by her reply that she wanted to only remain his friends.  Provide support and understanding as patient processed feelings of disappointment related to this outcome.  Explored collaboratively what he continues to learn about himself through his social interactions and communication when interested in girls.  He identified his tendency to develop feelings quickly and ways to manage this more effectively was identified as a need and explored collaboratively.   Interventions: Supportive therapy, motivational interviewing  Diagnoses:    ICD-10-CM   1. Adjustment disorder, unspecified type  F43.20           Plan: Patient is to use CBT, mindfulness and coping skills to help manage /decrease symptoms. Patient to continue to advocate and expressing his  thoughts and feelings related to his family and friends as needed.  Patient to continue to utilize his support system.   Long-term goal:  Reduce overall level, frequency, and intensity of the feelings of depression, anxiety and panic so that daily functioning is not impaired.  Short-term goal:  Increase insight into how his strained relationship with his father affects his feelings and behaviors        Increase awareness of his negative and/or critical communication with others                               Verbalize an understanding of the role that distorted thinking plays in creating fears, excessive worry, and ruminations.  Assessment of progress:  progressing    Anson [place], [facility]

## 2022-10-20 ENCOUNTER — Ambulatory Visit (INDEPENDENT_AMBULATORY_CARE_PROVIDER_SITE_OTHER): Payer: BC Managed Care – PPO | Admitting: Mental Health

## 2022-10-20 DIAGNOSIS — F4320 Adjustment disorder, unspecified: Secondary | ICD-10-CM | POA: Diagnosis not present

## 2022-10-20 NOTE — Progress Notes (Signed)
[facility] psychotherapy note  Name: Craig Chase Date: 10/20/2022 MRN: [id number] DOB: Mar 30, 2001 PCP: Sigmund Hazel, MD  Time spent: 34 Minutes   Treatment:  Individual therapy  Mental Status Exam:    Appearance:    Casual     Behavior:   Appropriate  Motor:   WNL  Speech/Language:    Clear and Coherent  Affect:   Full range   Mood:   Euthymic  Thought process:   normal  Thought content:     WNL  Sensory/Perceptual disturbances:     none  Orientation:   x4  Attention:   Good  Concentration:   Good  Memory:   Intact  Fund of knowledge:    Consistent with age and development  Insight:     Good  Judgment:    Good  Impulse Control:   Good    Reported Symptoms:   Some anxiety and sad feelings  Risk Assessment: Danger to Self:  No Self-injurious Behavior: No Danger to Others: No Duty to Warn:no Physical Aggression / Violence:No  Access to Firearms a concern: No  Gang Involvement:No  Patient / guardian was educated about steps to take if suicide or homicide risk level increases between visits: yes While future psychiatric events cannot be accurately predicted, the patient does not currently require acute inpatient psychiatric care and does not currently meet [facility] involuntary commitment criteria.  Medications: Current Outpatient Medications  Medication Sig Dispense Refill   albuterol (VENTOLIN HFA) 108 (90 Base) MCG/ACT inhaler 1-2 puffs     cetirizine (ZYRTEC ALLERGY) 10 MG tablet 1 tablet     doxycycline (VIBRA-TABS) 100 MG tablet Take 1 tablet (100 mg total) by mouth 2 (two) times daily. 20 tablet 0   ibuprofen (ADVIL,MOTRIN) 600 MG tablet Take 1 tablet (600 mg total) by mouth every 6 (six) hours as needed for moderate pain. 30 tablet 0   ondansetron (ZOFRAN) 8 MG tablet 1 tablet     predniSONE (STERAPRED UNI-PAK 21 TAB) 10 MG (21) TBPK tablet See admin instructions.     triamcinolone ointment (KENALOG) 0.1 % 1 application     No current  facility-administered medications for this visit.    Subjective: Patient presents for session on time.  He shared changes in relationships, he plans to spend the weekend with his best friend in about a month, hopes that he can get off work and plans to talk with his supervisor later today.  He considers himself a Pensions consultant and likes to have his plans firmed up as soon as possible.  Explored his relationship with his other close friend with whom he had some romantic feelings as discussed last session.  Time was spent to allow him to detail their recent communications and how he feels things are returning to "normal" in their relationship.  Facilitated his identifying what he has learned about himself through this experience where he shared changes in the way he may communicate going forward with others, of course given the nature of their relational dynamic.  He stated he has a tendency to respond quickly to others through text but plans to be less responsive with a primary focus on focusing on his own needs to an extent versus others where he stated he has a tendency to do often.  We explored how this connects to his building confidence financially and himself. He continues to enjoy his job, continues to also make progress academically.  Interventions: Supportive therapy, motivational interviewing  Diagnoses:  No diagnosis found.  Plan: Patient is to use CBT, mindfulness and coping skills to help manage /decrease symptoms. Patient to continue to advocate and expressing his thoughts and feelings related to his family and friends as needed.  Patient to continue to utilize his support system.   Long-term goal:  Reduce overall level, frequency, and intensity of the feelings of depression, anxiety and panic so that daily functioning is not impaired.  Short-term goal:  Increase insight into how his strained relationship with his father affects his feelings and behaviors        Increase awareness of  his negative and/or critical communication with others                              Verbalize an understanding of the role that distorted thinking plays in creating fears, excessive worry, and ruminations.  Assessment of progress:  progressing    [name] Session, [facility]

## 2022-11-24 ENCOUNTER — Ambulatory Visit: Payer: BC Managed Care – PPO | Admitting: Mental Health

## 2022-11-24 DIAGNOSIS — F4320 Adjustment disorder, unspecified: Secondary | ICD-10-CM

## 2022-11-24 NOTE — Progress Notes (Signed)
[facility] psychotherapy note  Name: Craig Chase Date: 11/24/2022 MRN: [id number] DOB: 08-Jul-2000 PCP: Sigmund Hazel, MD  Time spent: 99 Minutes   Treatment:  Individual therapy  Mental Status Exam:    Appearance:    Casual     Behavior:   Appropriate  Motor:   WNL  Speech/Language:    Clear and Coherent  Affect:   Full range   Mood:   Euthymic  Thought process:   normal  Thought content:     WNL  Sensory/Perceptual disturbances:     none  Orientation:   x4  Attention:   Good  Concentration:   Good  Memory:   Intact  Fund of knowledge:    Consistent with age and development  Insight:     Good  Judgment:    Good  Impulse Control:   Good    Reported Symptoms:   Some anxiety and sad feelings  Risk Assessment: Danger to Self:  No Self-injurious Behavior: No Danger to Others: No Duty to Warn:no Physical Aggression / Violence:No  Access to Firearms a concern: No  Gang Involvement:No  Patient / guardian was educated about steps to take if suicide or homicide risk level increases between visits: yes While future psychiatric events cannot be accurately predicted, the patient does not currently require acute inpatient psychiatric care and does not currently meet [facility] involuntary commitment criteria.  Medications: Current Outpatient Medications  Medication Sig Dispense Refill   albuterol (VENTOLIN HFA) 108 (90 Base) MCG/ACT inhaler 1-2 puffs     cetirizine (ZYRTEC ALLERGY) 10 MG tablet 1 tablet     doxycycline (VIBRA-TABS) 100 MG tablet Take 1 tablet (100 mg total) by mouth 2 (two) times daily. 20 tablet 0   ibuprofen (ADVIL,MOTRIN) 600 MG tablet Take 1 tablet (600 mg total) by mouth every 6 (six) hours as needed for moderate pain. 30 tablet 0   ondansetron (ZOFRAN) 8 MG tablet 1 tablet     predniSONE (STERAPRED UNI-PAK 21 TAB) 10 MG (21) TBPK tablet See admin instructions.     triamcinolone ointment (KENALOG) 0.1 % 1 application     No current  facility-administered medications for this visit.    Subjective: Patient presents for session.  Assessed progress where he stated he has been coping with stress at home related to his grandmother who is suffering from dementia and her symptoms worsening.  He stated she is currently inpatient at the hospital going on to share details leading up to her admission.  He stated is been highly stressful on the family and how they continue to try and adjust to her decline in health over the past 1 to 2 months.  At this point, he stated that she will probably have to go to an assisted living facility.  He went on to share other recent stressors, the relationship with one of his close friends with whom he has developed some romantic feelings.  He shared in detail some recent experiences they have continued to have, spending time together a few times a week and talking most days.  He identified his tendency to become romantically attached and some relationships in the past, noticing this pattern and himself as he admittedly wants a girlfriend and ultimately marriage.  He stated that his friend is to go on a year long trip that has been planned and how this is difficult for patient.  Provide support and understanding as he processed feelings related, sadness while also understanding that she needs this for herself.  Ways to manage his own emotions and assisted him in identifying thoughts to focus on where he plans to remind himself of they are having a friendship only at this point.  Interventions: Supportive therapy, motivational interviewing  Diagnoses:  No diagnosis found.       Plan: Patient is to use CBT, mindfulness and coping skills to help manage /decrease symptoms.  Patient to continue to utilize his support system.   Long-term goal:  Reduce overall level, frequency, and intensity of the feelings of depression, anxiety and panic so that daily functioning is not impaired.  Short-term goal:  Increase  insight into how his strained relationship with his father affects his feelings and behaviors        Increase awareness of his negative and/or critical communication with others                              Verbalize an understanding of the role that distorted thinking plays in creating fears, excessive worry, and ruminations.  Assessment of progress:  progressing    [name] Session, [facility]

## 2023-01-16 ENCOUNTER — Ambulatory Visit: Payer: BC Managed Care – PPO | Admitting: Mental Health

## 2023-01-16 DIAGNOSIS — F4320 Adjustment disorder, unspecified: Secondary | ICD-10-CM

## 2023-01-16 NOTE — Progress Notes (Signed)
[facility] psychotherapy note  Name: Craig Chase Date: 01/16/2023 MRN: [id number] DOB: 03/23/2001 PCP: Craig Hazel, MD  Time spent: 74 Minutes   Treatment:  Individual therapy  Mental Status Exam:    Appearance:    Casual     Behavior:   Appropriate  Motor:   WNL  Speech/Language:    Clear and Coherent  Affect:   Full range   Mood:   Euthymic  Thought process:   normal  Thought content:     WNL  Sensory/Perceptual disturbances:     none  Orientation:   x4  Attention:   Good  Concentration:   Good  Memory:   Intact  Fund of knowledge:    Consistent with age and development  Insight:     Good  Judgment:    Good  Impulse Control:   Good    Reported Symptoms:   Some anxiety and sad feelings  Risk Assessment: Danger to Self:  No Self-injurious Behavior: No Danger to Others: No Duty to Warn:no Physical Aggression / Violence:No  Access to Firearms a concern: No  Gang Involvement:No  Patient / guardian was educated about steps to take if suicide or homicide risk level increases between visits: yes While future psychiatric events cannot be accurately predicted, the patient does not currently require acute inpatient psychiatric care and does not currently meet [facility] involuntary commitment criteria.  Medications: Current Outpatient Medications  Medication Sig Dispense Refill   albuterol (VENTOLIN HFA) 108 (90 Base) MCG/ACT inhaler 1-2 puffs     cetirizine (ZYRTEC ALLERGY) 10 MG tablet 1 tablet     doxycycline (VIBRA-TABS) 100 MG tablet Take 1 tablet (100 mg total) by mouth 2 (two) times daily. 20 tablet 0   ibuprofen (ADVIL,MOTRIN) 600 MG tablet Take 1 tablet (600 mg total) by mouth every 6 (six) hours as needed for moderate pain. 30 tablet 0   ondansetron (ZOFRAN) 8 MG tablet 1 tablet     predniSONE (STERAPRED UNI-PAK 21 TAB) 10 MG (21) TBPK tablet See admin instructions.     triamcinolone ointment (KENALOG) 0.1 % 1 application     No current  facility-administered medications for this visit.    Subjective: Patient presents for session on time.  Assessed progress where he shared events related to the relationship with one of his close friends with whom he had had some romantic feelings.  He stated that since last session, he is had positive changes.  He stated that the relationship he has with his friend has been in a good place.  He went on to share how he is not trying to hurry the process, valuing their friendship but also recognizing that there is more to the relationship.  He is working not to [employer] between them, and how this has helped lower his anxiety and tendency to ruminate.  Facilitated ways he tries to stop this thinking that can lead to more anxiety.  He went on to share how his stepsister was getting married and invited him to her wedding which is next June.  He stated that he has reservations about going due to the complex, strained relationship he has with his father.  Uncertain of how his father will react around him given his history, patient plans to give this a lot of consideration for making a decision.  Interventions: Supportive therapy, motivational interviewing  Diagnoses:    ICD-10-CM   1. Adjustment disorder, unspecified type  F43.20  Plan: Patient is to use CBT, mindfulness and coping skills to help manage /decrease symptoms.  Patient to continue to utilize his support system.   Long-term goal:  Reduce overall level, frequency, and intensity of the feelings of depression, anxiety and panic so that daily functioning is not impaired.  Short-term goal:  Increase insight into how his strained relationship with his father affects his feelings and behaviors        Increase awareness of his negative and/or critical communication with others                              Verbalize an understanding of the role that distorted thinking plays in creating fears, excessive worry, and  ruminations.  Assessment of progress:  progressing    [name] Session, [facility]

## 2023-02-16 ENCOUNTER — Ambulatory Visit: Payer: BC Managed Care – PPO | Admitting: Mental Health

## 2023-02-16 DIAGNOSIS — F4320 Adjustment disorder, unspecified: Secondary | ICD-10-CM

## 2023-02-16 NOTE — Progress Notes (Signed)
[facility] psychotherapy note  Name: Craig Chase Date: 02/16/2023 MRN: [id number] DOB: 07/03/2001 PCP: Sigmund Hazel, MD  Time spent: 58 Minutes   Treatment:  Individual therapy  Mental Status Exam:    Appearance:    Casual     Behavior:   Appropriate  Motor:   WNL  Speech/Language:    Clear and Coherent  Affect:   Full range   Mood:   Euthymic  Thought process:   normal  Thought content:     WNL  Sensory/Perceptual disturbances:     none  Orientation:   x4  Attention:   Good  Concentration:   Good  Memory:   Intact  Fund of knowledge:    Consistent with age and development  Insight:     Good  Judgment:    Good  Impulse Control:   Good    Reported Symptoms:   Some anxiety and sad feelings  Risk Assessment: Danger to Self:  No Self-injurious Behavior: No Danger to Others: No Duty to Warn:no Physical Aggression / Violence:No  Access to Firearms a concern: No  Gang Involvement:No  Patient / guardian was educated about steps to take if suicide or homicide risk level increases between visits: yes While future psychiatric events cannot be accurately predicted, the patient does not currently require acute inpatient psychiatric care and does not currently meet [facility] involuntary commitment criteria.  Medications: Current Outpatient Medications  Medication Sig Dispense Refill   albuterol (VENTOLIN HFA) 108 (90 Base) MCG/ACT inhaler 1-2 puffs     cetirizine (ZYRTEC ALLERGY) 10 MG tablet 1 tablet     doxycycline (VIBRA-TABS) 100 MG tablet Take 1 tablet (100 mg total) by mouth 2 (two) times daily. 20 tablet 0   ibuprofen (ADVIL,MOTRIN) 600 MG tablet Take 1 tablet (600 mg total) by mouth every 6 (six) hours as needed for moderate pain. 30 tablet 0   ondansetron (ZOFRAN) 8 MG tablet 1 tablet     predniSONE (STERAPRED UNI-PAK 21 TAB) 10 MG (21) TBPK tablet See admin instructions.     triamcinolone ointment (KENALOG) 0.1 % 1 application     No current  facility-administered medications for this visit.    Subjective: Patient presents for session on time.  Assessed progress where he shared events related to the relationship with one of his close friends.  He stated that at this point they are not communicating.  He shared many details leading up to this point, how she was reached out to by her ex-boyfriend, this complicating their friendship.  Patient shared how he noticed that she was pulling back in their relationship in different ways and how when he tried to communicate with her, learned of this recent communication with her ex-boyfriend.  Patient identified his valuing their friendship, being mindful of boundaries in the relationship for the past few months.  He shared in session one of her last messages to him which was confrontative and in his view disrespectful.  At this point, he identified the need to distance himself in their relationship, at least temporarily due to her response.  Provide support and validation, facilitating patient identifying needs and how to cope and care for himself during this time of adjustment.  Interventions: Supportive therapy, motivational interviewing  Diagnoses:    ICD-10-CM   1. Adjustment disorder, unspecified type  F43.20         Plan: Patient is to use CBT, mindfulness and coping skills to help manage /decrease symptoms.  Patient to continue to utilize  his support system.   Long-term goal:  Reduce overall level, frequency, and intensity of the feelings of depression, anxiety and panic so that daily functioning is not impaired.  Short-term goal:  Increase insight into how his strained relationship with his father affects his feelings and behaviors        Increase awareness of his negative and/or critical communication with others                              Verbalize an understanding of the role that distorted thinking plays in creating fears, excessive worry, and ruminations.  Assessment of  progress:  progressing    [name] Session, [facility]

## 2023-03-16 ENCOUNTER — Ambulatory Visit: Payer: BC Managed Care – PPO | Admitting: Mental Health

## 2023-03-16 DIAGNOSIS — F4320 Adjustment disorder, unspecified: Secondary | ICD-10-CM

## 2023-03-16 NOTE — Progress Notes (Signed)
[facility] psychotherapy note  Name: Craig Chase Date: 03/16/2023 MRN: [id number] DOB: [birth year] PCP: Sigmund Hazel, MD  Time spent: 43 Minutes  Time in: 8 AM time out 8: 55 a.m.  Treatment:  Individual therapy  Mental Status Exam:    Appearance:    Casual     Behavior:   Appropriate  Motor:   WNL  Speech/Language:    Clear and Coherent  Affect:   Full range   Mood:   Euthymic  Thought process:   normal  Thought content:     WNL  Sensory/Perceptual disturbances:     none  Orientation:   x4  Attention:   Good  Concentration:   Good  Memory:   Intact  Fund of knowledge:    Consistent with age and development  Insight:     Good  Judgment:    Good  Impulse Control:   Good    Reported Symptoms:   Some anxiety and sad feelings  Risk Assessment: Danger to Self:  No Self-injurious Behavior: No Danger to Others: No Duty to Warn:no Physical Aggression / Violence:No  Access to Firearms a concern: No  Gang Involvement:No  Patient / guardian was educated about steps to take if suicide or homicide risk level increases between visits: yes While future psychiatric events cannot be accurately predicted, the patient does not currently require acute inpatient psychiatric care and does not currently meet [facility] involuntary commitment criteria.  Medications: Current Outpatient Medications  Medication Sig Dispense Refill   albuterol (VENTOLIN HFA) 108 (90 Base) MCG/ACT inhaler 1-2 puffs     cetirizine (ZYRTEC ALLERGY) 10 MG tablet 1 tablet     doxycycline (VIBRA-TABS) 100 MG tablet Take 1 tablet (100 mg total) by mouth 2 (two) times daily. 20 tablet 0   ibuprofen (ADVIL,MOTRIN) 600 MG tablet Take 1 tablet (600 mg total) by mouth every 6 (six) hours as needed for moderate pain. 30 tablet 0   ondansetron (ZOFRAN) 8 MG tablet 1 tablet     predniSONE (STERAPRED UNI-PAK 21 TAB) 10 MG (21) TBPK tablet See admin instructions.     triamcinolone ointment (KENALOG) 0.1 % 1  application     No current facility-administered medications for this visit.    Subjective: Patient presents for session on time. Patient shared recent events. He continues to cope with relationship issues with one of his closer friends. He stated it's continued to have no contact with this friend whom He had a falling out with about a month ago. Through guided Discovery, he identified facets of self-care that related to his being mindful of his not overextending himself in this relationship as he has had a tendency to do in the past with no only this relationship but others. Facilitated his identifying reasons he has comfortable making this decision. Patient was encouraged to recognize his personal strengths that he offers in relationships while also being mindful of needed boundaries as a way to cope and care for himself.     Interventions: Supportive therapy, motivational interviewing  Diagnoses:    ICD-10-CM   1. Adjustment disorder, unspecified type  F43.20          Plan: Patient is to use CBT, mindfulness and coping skills to help manage /decrease symptoms.  Patient to continue to utilize his support system.   Long-term goal:  Reduce overall level, frequency, and intensity of the feelings of depression, anxiety and panic so that daily functioning is not impaired.  Short-term goal:  Increase insight into  how his strained relationship with his father affects his feelings and behaviors        Increase awareness of his negative and/or critical communication with others                              Verbalize an understanding of the role that distorted thinking plays in creating fears, excessive worry, and ruminations.  Assessment of progress:  progressing    [name] Session, [facility]

## 2023-04-14 ENCOUNTER — Ambulatory Visit: Payer: BC Managed Care – PPO | Admitting: Mental Health

## 2023-04-14 DIAGNOSIS — F4320 Adjustment disorder, unspecified: Secondary | ICD-10-CM | POA: Diagnosis not present

## 2023-04-14 NOTE — Progress Notes (Signed)
[facility] psychotherapy note  Name: Craig Chase Date: 04/14/23 MRN: [id number] DOB: [birth year] PCP: Sigmund Hazel, MD  Time spent: 50 Minutes  Time in: 9 AM time out 9: 50 a.m.  Treatment:  Individual therapy  Mental Status Exam:    Appearance:    Casual     Behavior:   Appropriate  Motor:   WNL  Speech/Language:    Clear and Coherent  Affect:   Full range   Mood:   Sad, pleasant  Thought process:   normal  Thought content:     WNL  Sensory/Perceptual disturbances:     none  Orientation:   x4  Attention:   Good  Concentration:   Good  Memory:   Intact  Fund of knowledge:    Consistent with age and development  Insight:     Good  Judgment:    Good  Impulse Control:   Good    Reported Symptoms:   Some anxiety and sad feelings  Risk Assessment: Danger to Self:  No Self-injurious Behavior: No Danger to Others: No Duty to Warn:no Physical Aggression / Violence:No  Access to Firearms a concern: No  Gang Involvement:No  Patient / guardian was educated about steps to take if suicide or homicide risk level increases between visits: yes While future psychiatric events cannot be accurately predicted, the patient does not currently require acute inpatient psychiatric care and does not currently meet [facility] involuntary commitment criteria.  Medications: Current Outpatient Medications  Medication Sig Dispense Refill   albuterol (VENTOLIN HFA) 108 (90 Base) MCG/ACT inhaler 1-2 puffs     cetirizine (ZYRTEC ALLERGY) 10 MG tablet 1 tablet     doxycycline (VIBRA-TABS) 100 MG tablet Take 1 tablet (100 mg total) by mouth 2 (two) times daily. 20 tablet 0   ibuprofen (ADVIL,MOTRIN) 600 MG tablet Take 1 tablet (600 mg total) by mouth every 6 (six) hours as needed for moderate pain. 30 tablet 0   ondansetron (ZOFRAN) 8 MG tablet 1 tablet     predniSONE (STERAPRED UNI-PAK 21 TAB) 10 MG (21) TBPK tablet See admin instructions.     triamcinolone ointment (KENALOG) 0.1 %  1 application     No current facility-administered medications for this visit.    Subjective: Patient presents for session on time.  He shared how he had a significant talk with his friend.  He went on to share details, how he was able to be assertive and share his concerns based on their relationship issues that occurred about 2 months ago.  At this point he identified how he plans to continue to have contact with her but with clear boundaries.  Collaboratively, we explored ways to maintain boundaries and in what capacity.  Problems patient to identify details, process feelings related.  Facilitated his, clarifying feelings and how to handle the relationship at this point going forward.  He shared also that he visited extended family out of state recently, not seeing his father but saw other paternal family members.  He shared how he is often compared to his brother, giving some examples.  How this is affecting her in the past and how he currently does not allow this to upset him as in the past.  Interventions: Supportive therapy, motivational interviewing  Diagnoses:    ICD-10-CM   1. Adjustment disorder, unspecified type  F43.20           Plan: Patient is to use CBT, mindfulness and coping skills to help manage /decrease symptoms.  Patient  to continue to utilize his support system.   Long-term goal:  Reduce overall level, frequency, and intensity of the feelings of depression, anxiety and panic so that daily functioning is not impaired.  Short-term goal:  Increase insight into how his strained relationship with his father affects his feelings and behaviors        Increase awareness of his negative and/or critical communication with others                              Verbalize an understanding of the role that distorted thinking plays in creating fears, excessive worry, and ruminations.  Assessment of progress:  progressing    [name] Session, [facility]

## 2023-05-12 ENCOUNTER — Ambulatory Visit: Payer: BC Managed Care – PPO | Admitting: Mental Health

## 2023-05-12 DIAGNOSIS — F4320 Adjustment disorder, unspecified: Secondary | ICD-10-CM

## 2023-05-12 NOTE — Progress Notes (Signed)
[facility] psychotherapy note  Name: Panos Muraca Date: 05/12/23 MRN: [id number] DOB: [birth year] PCP: Sigmund Hazel, MD  Time spent: 15 Minutes  Time in: 8 AM time out: 8: 54 a.m.  Treatment:  Individual therapy  Mental Status Exam:    Appearance:    Casual     Behavior:   Appropriate  Motor:   WNL  Speech/Language:    Clear and Coherent  Affect:   Full range   Mood:   Euthymic  Thought process:   normal  Thought content:     WNL  Sensory/Perceptual disturbances:     none  Orientation:   x4  Attention:   Good  Concentration:   Good  Memory:   Intact  Fund of knowledge:    Consistent with age and development  Insight:     Good  Judgment:    Good  Impulse Control:   Good    Reported Symptoms:   Some anxiety and sad feelings  Risk Assessment: Danger to Self:  No Self-injurious Behavior: No Danger to Others: No Duty to Warn:no Physical Aggression / Violence:No  Access to Firearms a concern: No  Gang Involvement:No  Patient / guardian was educated about steps to take if suicide or homicide risk level increases between visits: yes While future psychiatric events cannot be accurately predicted, the patient does not currently require acute inpatient psychiatric care and does not currently meet [facility] involuntary commitment criteria.  Medications: Current Outpatient Medications  Medication Sig Dispense Refill   albuterol (VENTOLIN HFA) 108 (90 Base) MCG/ACT inhaler 1-2 puffs     cetirizine (ZYRTEC ALLERGY) 10 MG tablet 1 tablet     doxycycline (VIBRA-TABS) 100 MG tablet Take 1 tablet (100 mg total) by mouth 2 (two) times daily. 20 tablet 0   ibuprofen (ADVIL,MOTRIN) 600 MG tablet Take 1 tablet (600 mg total) by mouth every 6 (six) hours as needed for moderate pain. 30 tablet 0   ondansetron (ZOFRAN) 8 MG tablet 1 tablet     predniSONE (STERAPRED UNI-PAK 21 TAB) 10 MG (21) TBPK tablet See admin instructions.     triamcinolone ointment (KENALOG) 0.1 % 1  application     No current facility-administered medications for this visit.    Subjective: Patient presents for session on time.  Assessed progress.  Patient stated that he is no longer talking to a friend as discussed in previous visits recently.  He went on to share details regarding her last communication which was about a month ago.  At this point, he identified several factors and the reason why he wants to discontinue having contact.  Most of the session was centered around providing space for him to identify and process events and feelings.  Through further guided discovery, the patient will identify related core beliefs that this relationship which lasted approximately 8 months.  Identifying this tendency to give, in relationships, being there for others, being honest while being supportive.  He identified the need to set boundaries in the future in relationships where he starts to develop feelings the other person and within himself.  The types of boundaries and how to implement was explored collaboratively.  Interventions: Supportive therapy, motivational interviewing  Diagnoses:    ICD-10-CM   1. Adjustment disorder, unspecified type  F43.20            Plan: Patient is to use CBT, mindfulness and coping skills to help manage /decrease symptoms.  Patient to continue to utilize his support system.   Long-term  goal:  Reduce overall level, frequency, and intensity of the feelings of depression, anxiety and panic so that daily functioning is not impaired.  Short-term goal:  Increase insight into how his strained relationship with his father affects his feelings and behaviors        Increase awareness of his negative and/or critical communication with others                              Verbalize an understanding of the role that distorted thinking plays in creating fears, excessive worry, and ruminations.  Assessment of progress:  progressing    [name] Session, [facility]

## 2023-06-16 ENCOUNTER — Ambulatory Visit: Payer: BC Managed Care – PPO | Admitting: Mental Health

## 2023-06-16 DIAGNOSIS — F4320 Adjustment disorder, unspecified: Secondary | ICD-10-CM | POA: Diagnosis not present

## 2023-06-16 NOTE — Progress Notes (Signed)
[facility] psychotherapy note  Name: Craig Chase Date: 05/17/23 MRN: [id number] DOB: [birth year] PCP: Sigmund Hazel, MD  Time spent: 50 Minutes  Time in: 8:05 AM time out: 8: 55 a.m.  Treatment:  Individual therapy  Mental Status Exam:    Appearance:    Casual     Behavior:   Appropriate  Motor:   WNL  Speech/Language:    Clear and Coherent  Affect:   Full range   Mood:   Euthymic  Thought process:   normal  Thought content:     WNL  Sensory/Perceptual disturbances:     none  Orientation:   x4  Attention:   Good  Concentration:   Good  Memory:   Intact  Fund of knowledge:    Consistent with age and development  Insight:     Good  Judgment:    Good  Impulse Control:   Good    Reported Symptoms:   Some anxiety and sad feelings  Risk Assessment: Danger to Self:  No Self-injurious Behavior: No Danger to Others: No Duty to Warn:no Physical Aggression / Violence:No  Access to Firearms a concern: No  Gang Involvement:No  Patient / guardian was educated about steps to take if suicide or homicide risk level increases between visits: yes While future psychiatric events cannot be accurately predicted, the patient does not currently require acute inpatient psychiatric care and does not currently meet [facility] involuntary commitment criteria.  Medications: Current Outpatient Medications  Medication Sig Dispense Refill   albuterol (VENTOLIN HFA) 108 (90 Base) MCG/ACT inhaler 1-2 puffs     cetirizine (ZYRTEC ALLERGY) 10 MG tablet 1 tablet     doxycycline (VIBRA-TABS) 100 MG tablet Take 1 tablet (100 mg total) by mouth 2 (two) times daily. 20 tablet 0   ibuprofen (ADVIL,MOTRIN) 600 MG tablet Take 1 tablet (600 mg total) by mouth every 6 (six) hours as needed for moderate pain. 30 tablet 0   ondansetron (ZOFRAN) 8 MG tablet 1 tablet     predniSONE (STERAPRED UNI-PAK 21 TAB) 10 MG (21) TBPK tablet See admin instructions.     triamcinolone ointment (KENALOG) 0.1 % 1  application     No current facility-administered medications for this visit.    Subjective: Patient presents for session on time.  Assessed progress where patient shared how he continues to make progress, moving on emotionally from his past friendship as discussed in previous sessions.  He stated that they continue to have no contact although he did reach out to her to collect 2 books that he had lent her a few months ago.  He went on to share how he plans to continue to avoid having any contact and also how he has been mindful of his thoughts, setting some interpersonal boundaries, further allowing himself to heal and move on from the relationship.  His main focus is continuing to be mindful of boundaries as he shared insights about areas he has had to recognize boundaries are needed in relationships, what he is learned about himself over the last few months.  He has a silly integrated more mindfulness, engage in meditation to further cope and care for himself, work to reduce anxiety which he stated has been helpful and effective.  At this point, patient shared how he feels he is in a good place to discontinue therapy.  We discussed his progress up to this point and supported patient while inviting him to contact this office for future sessions if needed.   Interventions:  Supportive therapy, motivational interviewing  Diagnoses:    ICD-10-CM   1. Adjustment disorder, unspecified type  F43.20             Plan: Patient to contact this office for future sessions as needed.   [name] Session, [facility]

## 2024-03-25 ENCOUNTER — Ambulatory Visit: Admitting: Mental Health

## 2024-03-25 DIAGNOSIS — F4323 Adjustment disorder with mixed anxiety and depressed mood: Secondary | ICD-10-CM

## 2024-03-25 DIAGNOSIS — F4320 Adjustment disorder, unspecified: Secondary | ICD-10-CM | POA: Diagnosis not present

## 2024-03-25 NOTE — Progress Notes (Signed)
 [facility] psych[facility] note  Name: Craig Chase Date: 03/25/24 MRN: [id number] DOB: 12/10/2000 PCP: Cleotilde Planas, MD  Time spent: 54 Minutes  Time in: 8:05 AM time out: 8: 51a.m.  Treatment:  Individual therapy  Mental Status Exam:    Appearance:    Casual     Behavior:   Appropriate  Motor:   WNL  Speech/Language:    Clear and Coherent  Affect:   Full range   Mood:   Euthymic  Thought process:   normal  Thought content:     WNL  Sensory/Perceptual disturbances:     none  Orientation:   x4  Attention:   Good  Concentration:   Good  Memory:   Intact  Fund of knowledge:    Consistent with age and development  Insight:     Good  Judgment:    Good  Impulse Control:   Good    Reported Symptoms:   Some anxiety and sad feelings  Risk Assessment: Danger to Self:  No Self-injurious Behavior: No Danger to Others: No Duty to Warn:no Physical Aggression / Violence:No  Access to Firearms a concern: No  Gang Involvement:No  Patient / guardian was educated about steps to take if suicide or homicide risk level increases between visits: yes While future psychiatric events cannot be accurately predicted, the patient does not currently require acute inpatient psychiatric care and does not currently meet [place]  involuntary commitment criteria.  Medications: Current Outpatient Medications  Medication Sig Dispense Refill   albuterol (VENTOLIN HFA) 108 (90 Base) MCG/ACT inhaler 1-2 puffs     cetirizine (ZYRTEC ALLERGY) 10 MG tablet 1 tablet     doxycycline  (VIBRA -TABS) 100 MG tablet Take 1 tablet (100 mg total) by mouth 2 (two) times daily. 20 tablet 0   ibuprofen  (ADVIL ,MOTRIN ) 600 MG tablet Take 1 tablet (600 mg total) by mouth every 6 (six) hours as needed for moderate pain. 30 tablet 0   ondansetron (ZOFRAN) 8 MG tablet 1 tablet     predniSONE (STERAPRED UNI-PAK 21 TAB) 10 MG (21) TBPK tablet See admin instructions.     triamcinolone ointment (KENALOG) 0.1 % 1  application     No current facility-administered medications for this visit.    Subjective: Patient arrived on time for today's session.  Assessed needs where he shared life changes since his last therapy visit which was last year.  He stated that he continues to work his job at [employer], continues to take coursework.  He is undecided about a major however, he shared his steady progress over the last several months and continued goal to obtain a bachelor's degree.  He shared a recent difficulty where he felt high levels of distress due to work related issues.  He stated that he has surrounded himself with a close circle of friends, how these friendships have developed over the last several months, very meaningful to him.  He stated they went on a recent trip together and while he was away, his job did not find coverage for his position.  This, along with a lack of communication, left him feeling frustrated, dismayed at how his supervisor handled the situation leaving and feeling a sense of frustration and resentment.  He stated that his friends plan to move to [facility] potentially in the next 1 to 2 months.  He stated that this causes other feelings to surface, where we have facilitated his identifying his feelings ranging from sadness, anxiety contextualize and the experience of potential abandonment.  He stated that he works to rationalize the situation and work through these feelings.  Provide support and understanding throughout, facilitating his identifying thoughts, feelings, framing needs.  Interventions: Supportive therapy, CBT, motivational interviewing  Diagnoses:    ICD-10-CM   1. Adjustment disorder, unspecified type  F43.20     2. Adjustment disorder with mixed anxiety and depressed mood  F43.23        Plan: Patient is to use CBT, mindfulness and coping skills to help manage /decrease symptoms.  Patient to continue to utilize his support system.   Long-term goal:  Reduce overall  level, frequency, and intensity of the feelings of depression, anxiety for at least 3 consecutive months.   Short-term goal:  Increase insight into how his strained relationship with his father affects his feelings and behaviors                              Increase awareness of his negative and/or critical communication with others                              Verbalize an understanding of the role that distorted thinking plays in creating fears, excessive worry, and ruminations.      Lonni Fischer, [facility]

## 2024-04-11 ENCOUNTER — Ambulatory Visit: Admitting: Mental Health

## 2024-04-11 DIAGNOSIS — F4320 Adjustment disorder, unspecified: Secondary | ICD-10-CM

## 2024-04-11 NOTE — Progress Notes (Signed)
 [facility] psychotherapy note  Name: Craig Chase Date: 04/11/24 MRN: [id number] DOB: 2001/07/03 PCP: Cleotilde Planas, MD  Time spent: 50 Minutes  Time in: 8:05 AM time out: 8: 51a.m.  Treatment:  Individual therapy  Mental Status Exam:    Appearance:    Casual     Behavior:   Appropriate  Motor:   WNL  Speech/Language:    Clear and Coherent  Affect:   Full range   Mood:   Euthymic  Thought process:   normal  Thought content:     WNL  Sensory/Perceptual disturbances:     none  Orientation:   x4  Attention:   Good  Concentration:   Good  Memory:   Intact  Fund of knowledge:    Consistent with age and development  Insight:     Good  Judgment:    Good  Impulse Control:   Good    Reported Symptoms:   Some anxiety and sad feelings  Risk Assessment: Danger to Self:  No Self-injurious Behavior: No Danger to Others: No Duty to Warn:no Physical Aggression / Violence:No  Access to Firearms a concern: No  Gang Involvement:No  Patient / guardian was educated about steps to take if suicide or homicide risk level increases between visits: yes While future psychiatric events cannot be accurately predicted, the patient does not currently require acute inpatient psychiatric care and does not currently meet [place]  involuntary commitment criteria.  Medications: Current Outpatient Medications  Medication Sig Dispense Refill   albuterol (VENTOLIN HFA) 108 (90 Base) MCG/ACT inhaler 1-2 puffs     cetirizine (ZYRTEC ALLERGY) 10 MG tablet 1 tablet     doxycycline  (VIBRA -TABS) 100 MG tablet Take 1 tablet (100 mg total) by mouth 2 (two) times daily. 20 tablet 0   ibuprofen  (ADVIL ,MOTRIN ) 600 MG tablet Take 1 tablet (600 mg total) by mouth every 6 (six) hours as needed for moderate pain. 30 tablet 0   ondansetron (ZOFRAN) 8 MG tablet 1 tablet     predniSONE (STERAPRED UNI-PAK 21 TAB) 10 MG (21) TBPK tablet See admin instructions.     triamcinolone ointment (KENALOG) 0.1 % 1  application     No current facility-administered medications for this visit.    Subjective: Patient arrived on time for today's session.  Assessed progress where he shared how he is adjusting to his 2 friends moving out of town as discussed last session.  He stated that he is doing well all things considered, stated that he has been adjusting well to their absence, sees them at least once a week but talks with them throughout the week.  He stated that he is adjusting better than he initially had thought, was concerned initially about losing his friendships, this causing some anxiety and insecurity.  He shared how he has adjusted well, has perspective now and this was further explored collaboratively.  He stated that this tendency he feels comes from past relationships, most notably the relationship with his father, abandonment issues from his childhood.  Explored collaboratively, reviewing some notable past challenges in this relationship.  He stated that he visited his paternal side of the family who live in [place]  last June.  He stated he was able to have a  meaningful discussion with his uncle where he was able to share details, feelings related to the challenges he has had relationally with his father.  He stated that he has also spoken to other family members over the years.  Facilitated his identifying needs,  what he needs to hear from his father as well as behavioral changes needed that would give him more of a sense of security and connection with his father.   Interventions: Supportive therapy, CBT, motivational interviewing  Diagnoses:    ICD-10-CM   1. Adjustment disorder, unspecified type  F43.20         Plan: Patient is to use CBT, mindfulness and coping skills to help manage /decrease symptoms.  Patient to continue to utilize his support system.   Long-term goal:  Reduce overall level, frequency, and intensity of the feelings of depression, anxiety for at least 3 consecutive  months.   Short-term goal:  Increase insight into how his strained relationship with his father affects his feelings and behaviors                              Increase awareness of his negative and/or critical communication with others                              Verbalize an understanding of the role that distorted thinking plays in creating fears, excessive worry, and ruminations.      Lonni Fischer, [facility]

## 2024-05-08 ENCOUNTER — Ambulatory Visit: Admitting: Mental Health

## 2024-05-08 DIAGNOSIS — F4320 Adjustment disorder, unspecified: Secondary | ICD-10-CM

## 2024-05-08 NOTE — Progress Notes (Signed)
 [facility] psych[facility] note  Name: Craig Chase Date: 05/08/24 MRN: [id number] DOB: [birth year] PCP: Cleotilde Planas, MD  Time spent: 32 Minutes  Time in: 8:05 AM time out: 8: 50a.m.  Treatment:  Individual therapy  Mental Status Exam:    Appearance:    Casual     Behavior:   Appropriate  Motor:   WNL  Speech/Language:    Clear and Coherent  Affect:   Full range   Mood:   Euthymic  Thought process:   normal  Thought content:     WNL  Sensory/Perceptual disturbances:     none  Orientation:   x4  Attention:   Good  Concentration:   Good  Memory:   Intact  Fund of knowledge:    Consistent with age and development  Insight:     Good  Judgment:    Good  Impulse Control:   Good    Reported Symptoms:   Some anxiety and sad feelings  Risk Assessment: Danger to Self:  No Self-injurious Behavior: No Danger to Others: No Duty to Warn:no Physical Aggression / Violence:No  Access to Firearms a concern: No  Gang Involvement:No  Patient / guardian was educated about steps to take if suicide or homicide risk level increases between visits: yes While future psychiatric events cannot be accurately predicted, the patient does not currently require acute inpatient psychiatric care and does not currently meet [place]  involuntary commitment criteria.  Medications: Current Outpatient Medications  Medication Sig Dispense Refill   albuterol (VENTOLIN HFA) 108 (90 Base) MCG/ACT inhaler 1-2 puffs     cetirizine (ZYRTEC ALLERGY) 10 MG tablet 1 tablet     doxycycline  (VIBRA -TABS) 100 MG tablet Take 1 tablet (100 mg total) by mouth 2 (two) times daily. 20 tablet 0   ibuprofen  (ADVIL ,MOTRIN ) 600 MG tablet Take 1 tablet (600 mg total) by mouth every 6 (six) hours as needed for moderate pain. 30 tablet 0   ondansetron (ZOFRAN) 8 MG tablet 1 tablet     predniSONE (STERAPRED UNI-PAK 21 TAB) 10 MG (21) TBPK tablet See admin instructions.     triamcinolone ointment (KENALOG) 0.1 % 1  application     No current facility-administered medications for this visit.    Subjective: Patient arrived on time for today's session.  Assessed progress. He continues to adjust to his friends living further away, feels he's making progress and in some ways finds it to be a healthy balance with some of the relationships. He went on to provide context regarding some of his friendships, must notably with one girl with whom he had a romantic interest a few months ago. He went on to share the evolving dynamic and their relationship from friendship to couple and soon after, attempts to restart their friendship. Ways that he feels confident that he was clear about his expectations, feelings was shared as he stated that it's been more challenging for his friend. Where is he trying to be mindful of his communication as he wants to preserve the friendship.     Interventions: Supportive therapy, CBT, motivational interviewing  Diagnoses:    ICD-10-CM   1. Adjustment disorder, unspecified type  F43.20          Plan: Patient is to use CBT, mindfulness and coping skills to help manage /decrease symptoms.  Patient to continue to utilize his support system.   Long-term goal:  Reduce overall level, frequency, and intensity of the feelings of depression, anxiety for at least 3 consecutive months.  Short-term goal:  Increase insight into how his strained relationship with his father affects his feelings and behaviors                              Increase awareness of his negative and/or critical communication with others                              Verbalize an understanding of the role that distorted thinking plays in creating fears, excessive worry, and ruminations.      Lonni Fischer, [facility]

## 2024-05-28 ENCOUNTER — Ambulatory Visit: Admitting: Mental Health

## 2024-05-28 DIAGNOSIS — F4320 Adjustment disorder, unspecified: Secondary | ICD-10-CM | POA: Diagnosis not present

## 2024-05-28 NOTE — Progress Notes (Signed)
 [facility] psychotherapy note  Name: Craig Chase Date: 05/28/24 MRN: [id number] DOB: 10-24-2000 PCP: Cleotilde Planas, MD  Time spent: 50  Minutes  Time in: 8:00 AM time out: 8: 50a.m.  Treatment:  Individual therapy  Mental Status Exam:    Appearance:    Casual     Behavior:   Appropriate  Motor:   WNL  Speech/Language:    Clear and Coherent  Affect:   Full range   Mood:   Euthymic  Thought process:   normal  Thought content:     WNL  Sensory/Perceptual disturbances:     none  Orientation:   x4  Attention:   Good  Concentration:   Good  Memory:   Intact  Fund of knowledge:    Consistent with age and development  Insight:     Good  Judgment:    Good  Impulse Control:   Good    Reported Symptoms:   Some anxiety and sad feelings  Risk Assessment: Danger to Self:  No Self-injurious Behavior: No Danger to Others: No Duty to Warn:no Physical Aggression / Violence:No  Access to Firearms a concern: No  Gang Involvement:No  Patient / guardian was educated about steps to take if suicide or homicide risk level increases between visits: yes While future psychiatric events cannot be accurately predicted, the patient does not currently require acute inpatient psychiatric care and does not currently meet [place]  involuntary commitment criteria.  Medications: Current Outpatient Medications  Medication Sig Dispense Refill   albuterol (VENTOLIN HFA) 108 (90 Base) MCG/ACT inhaler 1-2 puffs     cetirizine (ZYRTEC ALLERGY) 10 MG tablet 1 tablet     doxycycline  (VIBRA -TABS) 100 MG tablet Take 1 tablet (100 mg total) by mouth 2 (two) times daily. 20 tablet 0   ibuprofen  (ADVIL ,MOTRIN ) 600 MG tablet Take 1 tablet (600 mg total) by mouth every 6 (six) hours as needed for moderate pain. 30 tablet 0   ondansetron (ZOFRAN) 8 MG tablet 1 tablet     predniSONE (STERAPRED UNI-PAK 21 TAB) 10 MG (21) TBPK tablet See admin instructions.     triamcinolone ointment (KENALOG) 0.1 % 1  application     No current facility-administered medications for this visit.    Subjective: Patient arrived on time for today's session.  Assessed progress where he shared how he continues to maintain some boundaries with a friendship as discussed last visit.  He went on to share how he feels that situation has improved, he has been mindful of how he communicates as he reviewed how at times he can communicate spontaneously and say the wrong thing.  He stated that due to these efforts he is comfortable with how he is communicated and he feels their friendship can be reestablished and move forward positively.  He stated that it also helps to have some physical distance as his friends live in [place] and he sees them typically every other week.  He continues to buy these friendships.  Going on he shared how he would potentially like to live in that area but also identified how he values his independence, eventually wanting to have his own residence.  He continues to live with his family, these relationships are going well, his mother and stepfather highly supportive.  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  He feels this will be helpful to the [facility] but also for his own professional development goals.  He plans to follow-up with leadership next week and hep C he will be given this opportunity.  He identified how he needed to be assertive and advocate for his needs.   Interventions: Supportive therapy, CBT, motivational interviewing  Diagnoses:    ICD-10-CM   1. Adjustment disorder, unspecified type  F43.20        Plan: Patient is to use CBT, mindfulness and coping skills to help manage /decrease symptoms.  Patient to continue to utilize his support system.   Long-term goal:  Reduce overall level, frequency, and intensity of the feelings of depression, anxiety for at least 3 consecutive months.    Short-term goal:  Increase insight into how his strained relationship with his father affects his feelings and behaviors                               Increase awareness of his negative and/or critical communication with others                               Verbalize an understanding of the role that distorted thinking plays in creating fears, excessive worry, and ruminations.      Lonni Fischer, [facility]

## 2024-06-24 ENCOUNTER — Ambulatory Visit: Admitting: Mental Health

## 2024-07-19 ENCOUNTER — Ambulatory Visit: Admitting: Mental Health

## 2024-07-19 DIAGNOSIS — F4320 Adjustment disorder, unspecified: Secondary | ICD-10-CM | POA: Diagnosis not present

## 2024-07-19 NOTE — Progress Notes (Signed)
 "  [facility] psychotherapy note  Name: Craig Chase Date: 07/19/2024 MRN: [id number] DOB: 2001/01/06 PCP: Cleotilde Planas, MD  Time spent: 53  Minutes  Time in: 10 AM time out: 10:51 AM  Treatment:  Individual therapy  Mental Status Exam:    Appearance:    Casual     Behavior:   Appropriate  Motor:   WNL  Speech/Language:    Clear and Coherent  Affect:   Full range   Mood:   Euthymic  Thought process:   normal  Thought content:     WNL  Sensory/Perceptual disturbances:     none  Orientation:   x4  Attention:   Good  Concentration:   Good  Memory:   Intact  Fund of knowledge:    Consistent with age and development  Insight:     Good  Judgment:    Good  Impulse Control:   Good    Reported Symptoms:   Some anxiety and sad feelings  Risk Assessment: Danger to Self:  No Self-injurious Behavior: No Danger to Others: No Duty to Warn:no Physical Aggression / Violence:No  Access to Firearms a concern: No  Gang Involvement:No  Patient / guardian was educated about steps to take if suicide or homicide risk level increases between visits: yes While future psychiatric events cannot be accurately predicted, the patient does not currently require acute inpatient psychiatric care and does not currently meet [place]  involuntary commitment criteria.  Medications: Current Outpatient Medications  Medication Sig Dispense Refill   albuterol (VENTOLIN HFA) 108 (90 Base) MCG/ACT inhaler 1-2 puffs     cetirizine (ZYRTEC ALLERGY) 10 MG tablet 1 tablet     doxycycline  (VIBRA -TABS) 100 MG tablet Take 1 tablet (100 mg total) by mouth 2 (two) times daily. 20 tablet 0   ibuprofen  (ADVIL ,MOTRIN ) 600 MG tablet Take 1 tablet (600 mg total) by mouth every 6 (six) hours as needed for moderate pain. 30 tablet 0   ondansetron (ZOFRAN) 8 MG tablet 1 tablet     predniSONE (STERAPRED UNI-PAK 21 TAB) 10 MG (21) TBPK tablet See admin instructions.     triamcinolone ointment (KENALOG) 0.1 % 1  application     No current facility-administered medications for this visit.    Subjective: Patient arrived on time for today's session.  Patient shared progress, how he had a pleasant holiday with family.  He went on to share how he did not get the job request to put him for more experience in a different department as discussed last session.  He stated it was upsetting and his morale dropped somewhat as a result.  Provide support throughout, facilitating his identifying feelings as well as how to reframe thoughts toward getting past the situation as he identified this as a need.  He was able to focus on how this job complements his school schedule, allows him for financial benefits and how he enjoys working with his immediate coworkers as motivation to continue to work his position.  Discussed friendships where he states they are going well, went on to share how he has thoughts at times about his father and paternal side of the family specific to relational strain has been ongoing with his father over the past few years.  He stated that he wants to have a discussion with his father again at some point, unsure of when.  He stated that he plans to be careful of his expectations but gather his thoughts and be affected with his communication, his feelings.  Interventions: Supportive therapy, CBT, motivational interviewing  Diagnoses:    ICD-10-CM   1. Adjustment disorder, unspecified type  F43.20         Plan: Patient is to use CBT, mindfulness and coping skills to help manage /decrease symptoms.  Patient to continue to utilize his support system.   Long-term goal:  Reduce overall level, frequency, and intensity of the feelings of depression, anxiety for at least 3 consecutive months.   Short-term goal:  Increase insight into how his strained relationship with his father affects his feelings and behaviors                               Increase awareness of his negative and/or critical  communication with others                               Verbalize an understanding of the role that distorted thinking plays in creating fears, excessive worry, and ruminations.      Lonni Fischer, [facility]  "

## 2024-08-16 ENCOUNTER — Ambulatory Visit: Admitting: Mental Health
